# Patient Record
Sex: Female | Born: 1975
Health system: Southern US, Community
[De-identification: ages and names within clinical notes are randomized; demographics above are authoritative.]

---

## 2013-11-25 ENCOUNTER — Encounter (HOSPITAL_BASED_OUTPATIENT_CLINIC_OR_DEPARTMENT_OTHER): Payer: Self-pay | Admitting: Emergency Medicine

## 2013-11-25 ENCOUNTER — Emergency Department (HOSPITAL_BASED_OUTPATIENT_CLINIC_OR_DEPARTMENT_OTHER)
Admission: EM | Admit: 2013-11-25 | Discharge: 2013-11-25 | Disposition: A | Discharge status: Home / Self Care | Payer: BC Managed Care – PPO | Attending: Emergency Medicine | Admitting: Emergency Medicine

## 2013-11-25 ENCOUNTER — Emergency Department (HOSPITAL_BASED_OUTPATIENT_CLINIC_OR_DEPARTMENT_OTHER): Payer: BC Managed Care – PPO

## 2013-11-25 DIAGNOSIS — M79602 Pain in left arm: Secondary | ICD-10-CM

## 2013-11-25 DIAGNOSIS — Y9301 Activity, walking, marching and hiking: Secondary | ICD-10-CM | POA: Diagnosis not present

## 2013-11-25 DIAGNOSIS — Y9289 Other specified places as the place of occurrence of the external cause: Secondary | ICD-10-CM | POA: Diagnosis not present

## 2013-11-25 DIAGNOSIS — M542 Cervicalgia: Secondary | ICD-10-CM | POA: Diagnosis not present

## 2013-11-25 DIAGNOSIS — W57XXXA Bitten or stung by nonvenomous insect and other nonvenomous arthropods, initial encounter: Secondary | ICD-10-CM

## 2013-11-25 DIAGNOSIS — S30861A Insect bite (nonvenomous) of abdominal wall, initial encounter: Secondary | ICD-10-CM | POA: Insufficient documentation

## 2013-11-25 DIAGNOSIS — R202 Paresthesia of skin: Secondary | ICD-10-CM

## 2013-11-25 LAB — CBC WITH DIFFERENTIAL/PLATELET
Basophils Absolute: 0 10*3/uL (ref 0.0–0.1)
Basophils Relative: 1 % (ref 0–1)
EOS PCT: 2 % (ref 0–5)
Eosinophils Absolute: 0.1 10*3/uL (ref 0.0–0.7)
HCT: 37.7 % (ref 36.0–46.0)
HEMOGLOBIN: 12.8 g/dL (ref 12.0–15.0)
LYMPHS ABS: 2.7 10*3/uL (ref 0.7–4.0)
LYMPHS PCT: 43 % (ref 12–46)
MCH: 30.3 pg (ref 26.0–34.0)
MCHC: 34 g/dL (ref 30.0–36.0)
MCV: 89.3 fL (ref 78.0–100.0)
Monocytes Absolute: 0.7 10*3/uL (ref 0.1–1.0)
Monocytes Relative: 11 % (ref 3–12)
NEUTROS PCT: 43 % (ref 43–77)
Neutro Abs: 2.6 10*3/uL (ref 1.7–7.7)
PLATELETS: 187 10*3/uL (ref 150–400)
RBC: 4.22 MIL/uL (ref 3.87–5.11)
RDW: 12.7 % (ref 11.5–15.5)
WBC: 6.1 10*3/uL (ref 4.0–10.5)

## 2013-11-25 LAB — BASIC METABOLIC PANEL
Anion gap: 13 (ref 5–15)
BUN: 9 mg/dL (ref 6–23)
CO2: 23 meq/L (ref 19–32)
Calcium: 9.3 mg/dL (ref 8.4–10.5)
Chloride: 105 mEq/L (ref 96–112)
Creatinine, Ser: 0.7 mg/dL (ref 0.50–1.10)
GFR calc Af Amer: >90 mL/min (ref >90)
GFR calc non Af Amer: >90 mL/min (ref >90)
GLUCOSE: 100 mg/dL — AB (ref 70–99)
POTASSIUM: 3.8 meq/L (ref 3.7–5.3)
Sodium: 141 mEq/L (ref 137–147)

## 2013-11-25 LAB — TROPONIN I: Troponin I: <0.3 ng/mL (ref <0.30)

## 2013-11-25 NOTE — ED Provider Notes (Signed)
CSN: [id number]     Arrival date & time 11/25/13  1913 History  This chart was scribed for Purvis Sheffield, MD by Elon Spanner, ED Scribe. This patient was seen in room MHFT1/MHFT1 and the patient's care was started at 7:41PM.   Chief Complaint  Patient presents with  . Insect Bite   The history is provided by the patient. No language interpreter was used.    HPI Comments: April Powell is a 38 y.o. female with no pertinent medical history who presents to the Emergency Department complaining of an insect bite on her left upper abdomen 1 week ago with associated arm and neck pain.  She reports she was walking when she felt a stinging sensation but denies seeing the insect.  She reports a white bump formed initially.  4 days ago, she reports the arm pain began and changed from intermittent to constant coupled with the onset of her neck pain yesterday.  She describes the left-sided neck and arm pain as throbbing and burning, as well as numb/tingling sensation in her left arm.   Patient denies family history of MI.  Patient denies smoking.  Patient denies known insect allergies.  Patient denies previous allergic reactions.      History reviewed. No pertinent past medical history. History reviewed. No pertinent past surgical history. History reviewed. No pertinent family history. History  Substance Use Topics  . Smoking status: Never Smoker   . Smokeless tobacco: Not on file  . Alcohol Use: No   OB History   Grav Para Term Preterm Abortions TAB SAB Ect Mult Living                 Review of Systems  Constitutional: Negative for appetite change and fatigue.  HENT: Negative for congestion, ear discharge and sinus pressure.   Eyes: Negative for discharge.  Respiratory: Negative for cough.   Cardiovascular: Negative for chest pain.  Gastrointestinal: Negative for abdominal pain and diarrhea.  Genitourinary: Negative for frequency and hematuria.  Musculoskeletal: Positive for myalgias.  Negative for back pain.  Skin: Positive for wound. Negative for rash.  Neurological: Positive for numbness. Negative for seizures and headaches.  Psychiatric/Behavioral: Negative for hallucinations.      Allergies  Review of patient's allergies indicates no known allergies.  Home Medications   Prior to Admission medications   Not on File   BP 111/78  Pulse 80  Temp(Src) 97.6 F (36.4 C) (Oral)  Resp 18  Ht 5\' 6"  (1.676 m)  Wt 130 lb (58.968 kg)  BMI 20.99 kg/m2  SpO2 100%  LMP 11/01/2013 Physical Exam  Nursing note and vitals reviewed. Constitutional: She is oriented to person, place, and time. She appears well-developed and well-nourished.  Non-toxic appearance. She does not appear ill. No distress.  HENT:  Head: Normocephalic and atraumatic.  Right Ear: External ear normal.  Left Ear: External ear normal.  Nose: Nose normal. No mucosal edema or rhinorrhea.  Mouth/Throat: Oropharynx is clear and moist and mucous membranes are normal. No dental abscesses or uvula swelling.  Eyes: Conjunctivae and EOM are normal. Pupils are equal, round, and reactive to light.  Neck: Normal range of motion and full passive range of motion without pain. Neck supple.  Cardiovascular: Normal rate, regular rhythm and normal heart sounds.  Exam reveals no gallop and no friction rub.   No murmur heard. Pulmonary/Chest: Effort normal and breath sounds normal. No respiratory distress. She has no wheezes. She has no rhonchi. She has no rales. She exhibits  no tenderness and no crepitus.  Abdominal: Soft. Normal appearance and bowel sounds are normal. She exhibits no distension. There is no tenderness. There is no rebound and no guarding.  Small circular erythematous area with central excoriation.   Musculoskeletal: Normal range of motion. She exhibits tenderness. She exhibits no edema.  Moves all extremities well.   Mild TTP of the left trapezius and left arm diffusely.   Normal symmettric  appearance of the UE's    Neurological: She is alert and oriented to person, place, and time. She has normal strength. No cranial nerve deficit.  alert, oriented x3 speech: normal in context and clarity memory: intact grossly cranial nerves II-XII: intact motor strength: full proximally and distally no involuntary movements or tremors sensation: intact to light touch diffusely  cerebellar: finger-to-nose and heel-to-shin intact gait: normal forwards and backwards  Skin: Skin is warm, dry and intact. No rash noted. No erythema. No pallor.  Psychiatric: She has a normal mood and affect. Her speech is normal and behavior is normal. Her mood appears not anxious.    ED Course  Procedures (including critical care time)  DIAGNOSTIC STUDIES: Oxygen Saturation is 100% on RA, normal by my interpretation.    COORDINATION OF CARE:  7:53 PM Will order labs and imaging.  Patient acknowledges and agrees with plan.    Labs Review Labs Reviewed  BASIC METABOLIC PANEL - Abnormal; Notable for the following:    Glucose, Bld 100 (*)    All other components within normal limits  CBC WITH DIFFERENTIAL  TROPONIN I    Imaging Review Dg Chest 2 View  11/25/2013   CLINICAL DATA:  Pain beneath the left breast. Status post insect bite on the upper abdomen 1 week ago.  EXAM: CHEST  2 VIEW  COMPARISON:  None.  FINDINGS: Heart size and mediastinal contours are within normal limits. Both lungs are clear. Visualized skeletal structures are unremarkable.  IMPRESSION: Negative exam.   Electronically Signed   By: Drusilla Kannerhomas  Dalessio M.D.   On: 11/25/2013 20:34     EKG Interpretation   Date/Time:  Friday November 25 2013 19:32:45 EDT Ventricular Rate:  68 PR Interval:  142 QRS Duration: 108 QT Interval:  392 QTC Calculation: 416 R Axis:   93 Text Interpretation:  Normal sinus rhythm Rightward axis Incomplete right  bundle branch block Nonspecific ST abnormality no previous for comparison  Confirmed by  Kamy Poinsett  MD, Morissa Obeirne ([id number]) on 11/25/2013 7:35:02 PM      MDM   Final diagnoses:  Insect bite  Arm paresthesia, left  Left arm pain    9:15 PM 38 y.o. female here with an insect stain which occurred approximately one week ago and also some left arm and left lateral neck paresthesias and pain. [facility]. She has a normal neurologic exam. Her symptoms are atypical for a cardiac cause. She has no risk factors for heart disease. Her vital signs are unremarkable. Will get screening labs and imaging.  9:15 PM: I interpreted/reviewed the labs and/or imaging which were non-contributory.  Unsure of what is causing pt's sx, unlikely that her sx are related to an insect sting 1 week ago. She is low risk for MACE per HEART score. Do not think this is a neurological problem such as CVA.  I have discussed the diagnosis/risks/treatment options with the patient and believe the pt to be eligible for discharge home to follow-up with her pcp next week. We  also discussed returning to the ED immediately if new or worsening sx occur. We discussed the sx which are most concerning (e.g., weakness, numbness, worsening pain) that necessitate immediate return. Medications administered to the patient during their visit and any new prescriptions provided to the patient are listed below.  Medications given during this visit Medications - No data to display  New Prescriptions   No medications on file     I personally performed the services described in this documentation, which was scribed in my presence. The recorded information has been reviewed and is accurate.    Purvis SheffieldForrest Master Touchet, MD 11/26/13 0010

## 2013-11-25 NOTE — ED Notes (Signed)
MD at bedside. 

## 2013-11-25 NOTE — ED Notes (Signed)
Pt c/o left  lower abd insect bite x 1 week ago

## 2014-01-24 ENCOUNTER — Encounter: Payer: Self-pay | Admitting: *Deleted

## 2014-01-24 ENCOUNTER — Emergency Department
Admission: EM | Admit: 2014-01-24 | Discharge: 2014-01-24 | Disposition: A | Discharge status: Home / Self Care | Payer: BC Managed Care – PPO | Source: Home / Self Care | Attending: Emergency Medicine | Admitting: Emergency Medicine

## 2014-01-24 DIAGNOSIS — N898 Other specified noninflammatory disorders of vagina: Secondary | ICD-10-CM

## 2014-01-24 LAB — POCT URINE PREGNANCY: Preg Test, Ur: NEGATIVE

## 2014-01-24 MED ORDER — FLUCONAZOLE 200 MG PO TABS: 200.0000 mg | ORAL_TABLET | Freq: Every day | ORAL | Status: DC

## 2014-01-24 NOTE — ED Notes (Signed)
Pt c/o yellow vaginal d/c with itching x 4-5 days. Denies fever, pain or odor. She reports a normal Pap smear approximately 1 year ago.

## 2014-01-24 NOTE — ED Provider Notes (Signed)
CSN: [id number]     Arrival date & time 01/24/14  1500 History   First MD Initiated Contact with Patient 01/24/14 1508     Chief Complaint  Patient presents with  . Vaginal Discharge    HPI Pt c/o light yellow/whitish vaginal d/c with itching x 4-5 days. Denies fever, pain or odor.  History of completing antibiotic (for sinus infection) 7 days ago.--Has had yeast infections in the past after completing antibiotics. She reports a normal [facility] 1 year ago. Denies abdominal or pelvic pain. No fever or chills or nausea or vomiting. Denies history of STD Last menstrual period about 25 days ago. Denies ENT symptoms or chest pain or shortness of breath or GI symptoms. Denies urinary symptoms. Denies dysuria, hematuria. Denies back pain History reviewed. No pertinent past medical history. History reviewed. No pertinent past surgical history. Family History  Problem Relation Age of Onset  . Cancer Mother     Cervical  . Diabetes Father   . Hypertension Father    History  Substance Use Topics  . Smoking status: Never Smoker   . Smokeless tobacco: Not on file  . Alcohol Use: No   OB History    No data available     Review of Systems  All other systems reviewed and are negative.   Allergies  Review of patient's allergies indicates no known allergies.  Home Medications   Prior to Admission medications   Medication Sig Start Date End Date Taking? Authorizing Provider  fluconazole (DIFLUCAN) 200 MG tablet Take 1 tablet (200 mg total) by mouth daily. For 3 days 01/24/14   Lajean Manesavid Massey, MD   BP 99/63 mmHg  Pulse 71  Temp(Src) 98.2 F (36.8 C) (Oral)  Resp 16  Ht 5\' 8"  (1.727 m)  Wt 129 lb (58.514 kg)  BMI 19.62 kg/m2  SpO2 100%  LMP 01/01/2014 Physical Exam  Constitutional: She is oriented to person, place, and time. She appears well-developed and well-nourished. No distress.  HENT:  Head: Normocephalic and  atraumatic.  Eyes: Conjunctivae and EOM are normal. Pupils are equal, round, and reactive to light. No scleral icterus.  Neck: Normal range of motion.  Cardiovascular: Normal rate.   Pulmonary/Chest: Effort normal.  Abdominal: She exhibits no distension.  Musculoskeletal: Normal range of motion.  Neurological: She is alert and oriented to person, place, and time.  Skin: Skin is warm. No rash noted.  Psychiatric: She has a normal mood and affect.  Nursing note and vitals reviewed.  She declined GYN exam today ED Course  Procedures (including critical care time) Labs Review Labs Reviewed  WET PREP, GENITAL  GC/CHLAMYDIA PROBE AMP, URINE  POCT URINE PREGNANCY    MDM   1. Vaginal discharge    urine pregnancy test negative. Most likely, has vaginal yeast infection based on symptoms and history of completing antibiotic (for sinus infection) 7 days ago. She declined GYN exam today. Treatment options discussed, as well as risks, benefits, alternatives. Patient voiced understanding and agreement with the following plans: Begin empiric treatment for vaginal yeast infection. New Prescriptions   FLUCONAZOLE (DIFLUCAN) 200 MG TABLET    Take 1 tablet (200 mg total) by mouth daily. For 3 days   she states she is monogamous with husband, and she requests ("to be on the safe side'), to do various STD testing. From urine and self swab, tests sent for wet prep, GC, chlamydia. We can add or change treatment based on  these tests sent to reference lab. --(She declined any blood work-STD testing.) Over 20 minutes spent, greater than 50% of the time spent for counseling and coordination of care. Follow-up with your primary care doctor in 5-7 days if not improving, or sooner if symptoms become worse. Precautions discussed. Red flags discussed. Questions invited and answered. Patient voiced understanding and agreement.     Lajean Manesavid Massey, MD 01/24/14 [phone]

## 2014-01-25 LAB — GC/CHLAMYDIA PROBE AMP, URINE
Chlamydia, Swab/Urine, PCR: NEGATIVE
GC Probe Amp, Urine: NEGATIVE

## 2014-01-25 LAB — WET PREP, GENITAL
Clue Cells Wet Prep HPF POC: NONE SEEN
Trich, Wet Prep: NONE SEEN
WBC, Wet Prep HPF POC: NONE SEEN
Yeast Wet Prep HPF POC: NONE SEEN

## 2014-02-22 ENCOUNTER — Emergency Department
Admission: EM | Admit: 2014-02-22 | Discharge: 2014-02-22 | Disposition: A | Discharge status: Home / Self Care | Payer: BLUE CROSS/BLUE SHIELD | Source: Home / Self Care | Attending: Family Medicine | Admitting: Family Medicine

## 2014-02-22 ENCOUNTER — Encounter: Payer: Self-pay | Admitting: *Deleted

## 2014-02-22 DIAGNOSIS — L309 Dermatitis, unspecified: Secondary | ICD-10-CM

## 2014-02-22 LAB — POCT CBC W AUTO DIFF (K'VILLE URGENT CARE)

## 2014-02-22 MED ORDER — TERBINAFINE 1 % EX GEL: CUTANEOUS | Status: DC

## 2014-02-22 MED ORDER — HYDROCORTISONE 2.5 % EX CREA: TOPICAL_CREAM | Freq: Two times a day (BID) | CUTANEOUS | Status: DC

## 2014-02-22 NOTE — Discharge Instructions (Signed)
Eczema Eczema, also called atopic dermatitis, is a skin disorder that causes inflammation of the skin. It causes a red rash and dry, scaly skin. The skin becomes very itchy. Eczema is generally worse during the cooler winter months and often improves with the warmth of summer. Eczema usually starts showing signs in infancy. Some children outgrow eczema, but it may last through adulthood.  CAUSES  The exact cause of eczema is not known, but it appears to run in families. People with eczema often have a family history of eczema, allergies, asthma, or hay fever. Eczema is not contagious. Flare-ups of the condition may be caused by:   Contact with something you are sensitive or allergic to.   Stress. SIGNS AND SYMPTOMS  Dry, scaly skin.   Red, itchy rash.   Itchiness. This may occur before the skin rash and may be very intense.  DIAGNOSIS  The diagnosis of eczema is usually made based on symptoms and medical history. TREATMENT  Eczema cannot be cured, but symptoms usually can be controlled with treatment and other strategies. A treatment plan might include:  Controlling the itching and scratching.   Use over-the-counter antihistamines as directed for itching. This is especially useful at night when the itching tends to be worse.   Use over-the-counter steroid creams as directed for itching.   Avoid scratching. Scratching makes the rash and itching worse. It may also result in a skin infection (impetigo) due to a break in the skin caused by scratching.   Keeping the skin well moisturized with creams every day. This will seal in moisture and help prevent dryness. Lotions that contain alcohol and water should be avoided because they can dry the skin.   Limiting exposure to things that you are sensitive or allergic to (allergens).   Recognizing situations that cause stress.   Developing a plan to manage stress.  HOME CARE INSTRUCTIONS   Only take over-the-counter or  prescription medicines as directed by your health care provider.   Do not use anything on the skin without checking with your health care provider.   Keep baths or showers short (5 minutes) in warm (not hot) water. Use mild cleansers for bathing. These should be unscented. You may add nonperfumed bath oil to the bath water. It is best to avoid soap and bubble bath.   Immediately after a bath or shower, when the skin is still damp, apply a moisturizing ointment to the entire body. This ointment should be a petroleum ointment. This will seal in moisture and help prevent dryness. The thicker the ointment, the better. These should be unscented.   Keep fingernails cut short. Children with eczema may need to wear soft gloves or mittens at night after applying an ointment.   Dress in clothes made of cotton or cotton blends. Dress lightly, because heat increases itching.   A child with eczema should stay away from anyone with fever blisters or cold sores. The virus that causes fever blisters (herpes simplex) can cause a serious skin infection in children with eczema. SEEK MEDICAL CARE IF:   Your itching interferes with sleep.   Your rash gets worse or is not better within 1 week after starting treatment.   You see pus or soft yellow scabs in the rash area.   You have a fever.   You have a rash flare-up after contact with someone who has fever blisters.  Document Released: 01/25/2000 Document Revised: 11/17/2012 Document Reviewed: 08/30/2012 [employer] Patient Information 2015 [employer]. This information   is not intended to replace advice given to you by your health care provider. Make sure you discuss any questions you have with your health care provider.  

## 2014-02-22 NOTE — ED Notes (Signed)
April Powell c/o rash to bilateral hands x 3 weeks. C/o itching. Tried benadryl and triamcinolone cream without relief.

## 2014-02-22 NOTE — ED Provider Notes (Signed)
CSN: [id number]     Arrival date & time 02/22/14  1449 History   First MD Initiated Contact with Patient 02/22/14 1515     Chief Complaint  Patient presents with  . Rash      HPI Comments: Patient complains of 3 week history of persistent pruritic rash on her left dorsal wrist and right volar wrist. No known contact with allergens.  She feels well otherwise.  Patient is a 39 y.o. female presenting with rash. The history is provided by the patient.  Rash Location: bilateral wrists. Quality: itchiness   Severity:  Mild Onset quality:  Gradual Duration:  3 weeks Timing:  Constant Progression:  Unchanged Chronicity:  New Context: not animal contact, not chemical exposure, not exposure to similar rash, not insect bite/sting, not medications, not new detergent/soap, not plant contact and not sick contacts   Relieved by:  Nothing Worsened by:  Nothing tried Ineffective treatments:  Antibiotic cream and anti-itch cream Associated symptoms: no fatigue, no fever, no joint pain, no myalgias and no sore throat     History reviewed. No pertinent past medical history. History reviewed. No pertinent past surgical history. Family History  Problem Relation Age of Onset  . Cancer Mother     Cervical  . Diabetes Father   . Hypertension Father    History  Substance Use Topics  . Smoking status: Never Smoker   . Smokeless tobacco: Not on file  . Alcohol Use: No   OB History    No data available     Review of Systems  Constitutional: Negative for fever and fatigue.  HENT: Negative for sore throat.   Musculoskeletal: Negative for myalgias and arthralgias.  Skin: Positive for rash.  All other systems reviewed and are negative.   Allergies  Review of patient's allergies indicates no known allergies.  Home Medications   Prior to Admission medications   Medication Sig Start Date End Date Taking? Authorizing Provider  fluconazole (DIFLUCAN) 200 MG tablet Take 1 tablet (200 mg total)  by mouth daily. For 3 days 01/24/14   Lajean Manesavid Massey, MD  hydrocortisone 2.5 % cream Apply topically 2 (two) times daily. 02/22/14   Lattie HawStephen A Judah Chevere, MD  Terbinafine 1 % GEL Apply to affected area twice daily for one week 02/22/14   Lattie HawStephen A Jerimah Witucki, MD   BP 105/71 mmHg  Pulse 71  Temp(Src) 98.6 F (37 C) (Oral)  Resp 14  Wt 131 lb (59.421 kg)  SpO2 100%  LMP 01/31/2014 Physical Exam  Constitutional: She is oriented to person, place, and time. She appears well-developed and well-nourished. No distress.  HENT:  Head: Normocephalic.  Mouth/Throat: Oropharynx is clear and moist.  Eyes: Conjunctivae are normal. Pupils are equal, round, and reactive to light.  Neck: Neck supple.  Cardiovascular: Normal heart sounds.   Pulmonary/Chest: Breath sounds normal.  Lymphadenopathy:    She has no cervical adenopathy.  Neurological: She is alert and oriented to person, place, and time.  Skin: Skin is warm and dry. Rash noted. Rash is macular.     Confluent macular eruption, slightly erythematous, present on left dorsal wrist and right volar wrist as noted on diagram.    Nursing note and vitals reviewed.   ED Course  Procedures  None    Labs Reviewed  POCT CBC W AUTO DIFF ([facility]):  WBC 6.1; LY 44.9; MO 4.9; GR 50.2; Hgb 12.8; Platelets 194          MDM   1. Dermatitis; ?  atypical tinea versicolor, ?eczema    Begin empiric terbinafine gel BID for one week, and hydrocortisone 2.5% cream BID for one week. Followup with dermatologist if not improved in one week    Lattie Haw, MD 02/26/14 2043

## 2015-11-26 IMAGING — CR DG CHEST 2V
2 series · 2 of 2 positions shown · non-contrast
Comparison: None.

CLINICAL DATA: Pain beneath the left breast. Status post insect
bite on the upper abdomen 1 week ago.

EXAM:
CHEST  2 VIEW

[w chest pa]
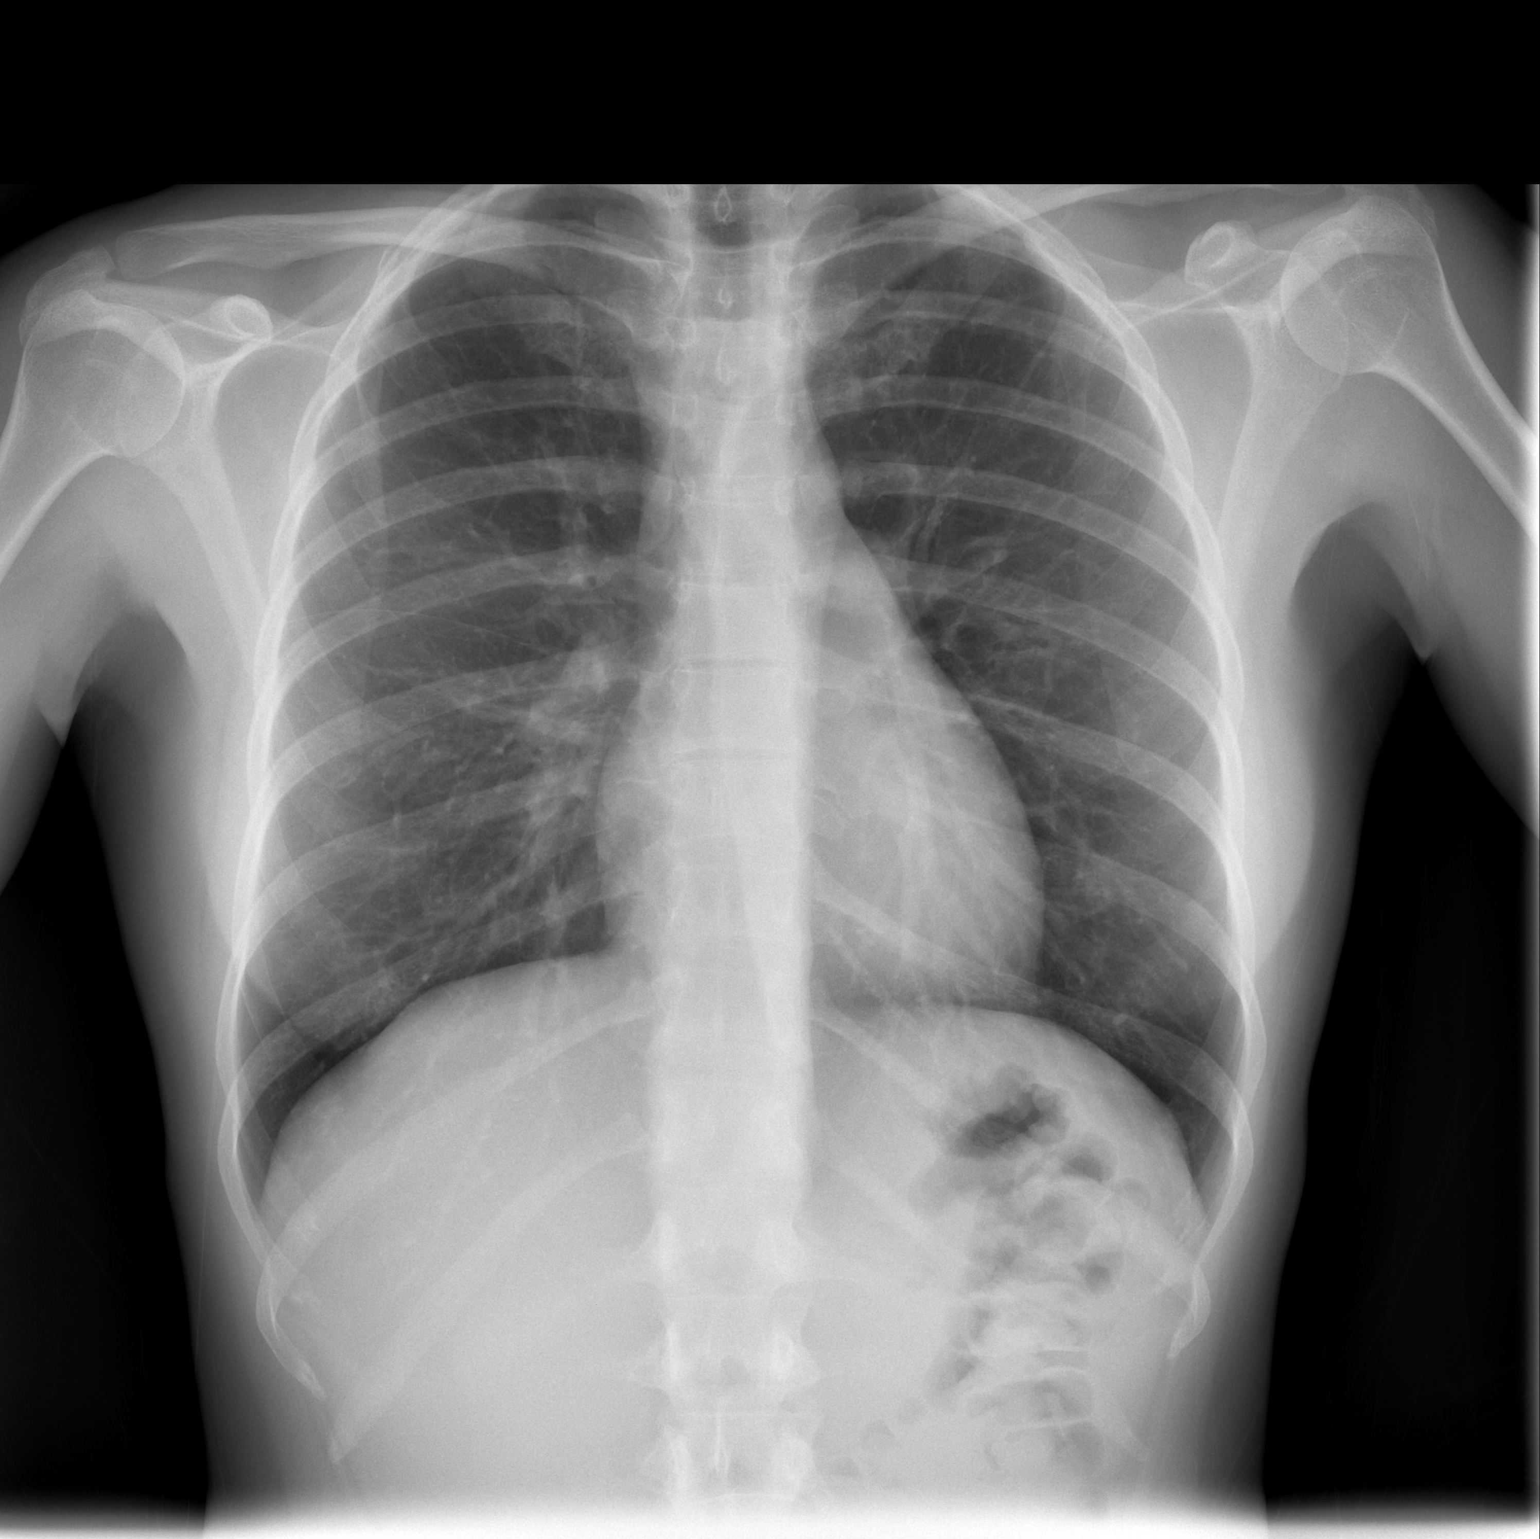

[w chest lat]
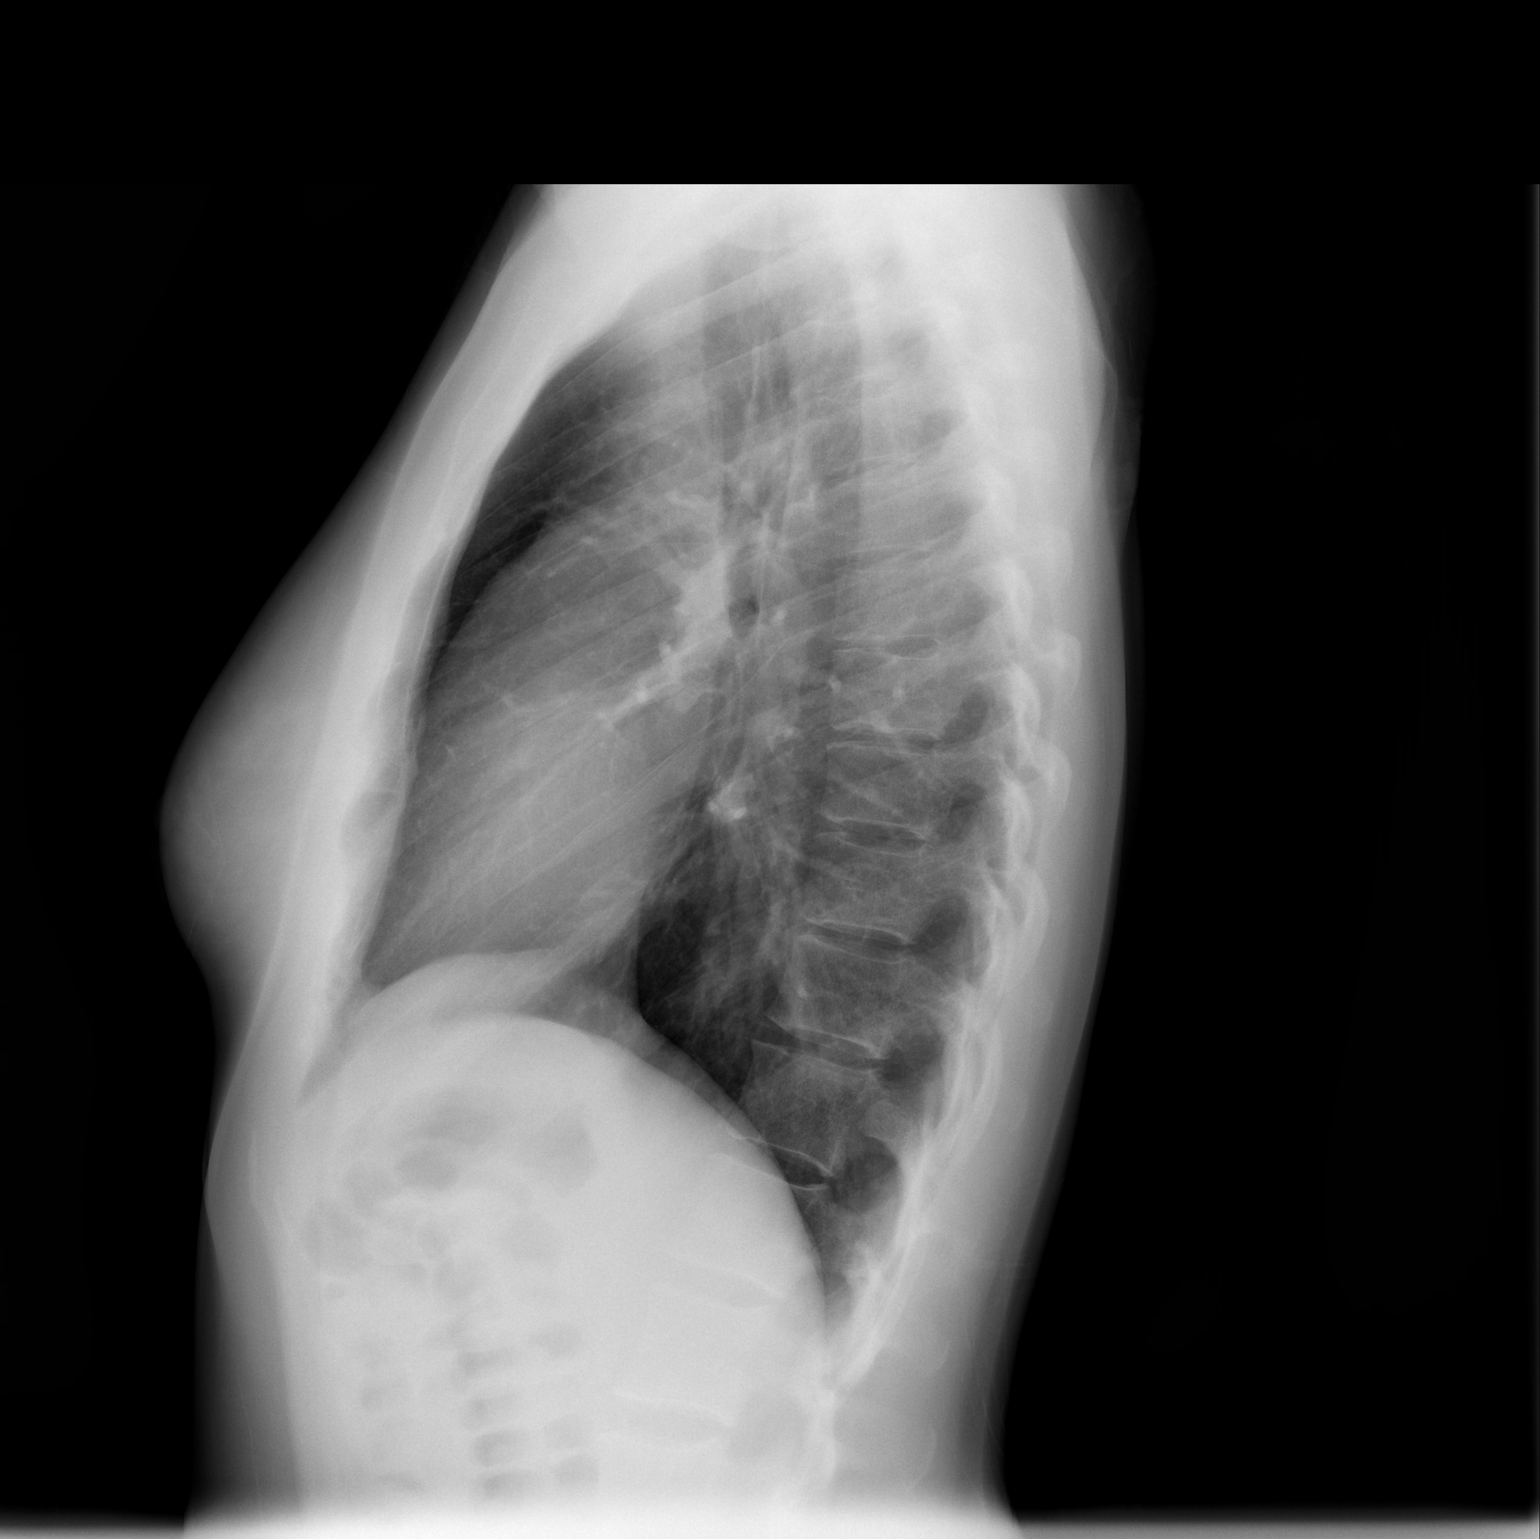

[2 of 2 positions shown; findings below may reference images not displayed]

FINDINGS: Heart size and mediastinal contours are within normal limits. Both
lungs are clear. Visualized skeletal structures are unremarkable.
IMPRESSION: Negative exam.

## 2016-01-16 ENCOUNTER — Encounter: Payer: Self-pay | Admitting: Emergency Medicine

## 2016-01-16 ENCOUNTER — Emergency Department (INDEPENDENT_AMBULATORY_CARE_PROVIDER_SITE_OTHER)
Admission: EM | Admit: 2016-01-16 | Discharge: 2016-01-16 | Disposition: A | Discharge status: Home / Self Care | Payer: BLUE CROSS/BLUE SHIELD | Source: Home / Self Care | Attending: Family Medicine | Admitting: Family Medicine

## 2016-01-16 DIAGNOSIS — G5601 Carpal tunnel syndrome, right upper limb: Secondary | ICD-10-CM

## 2016-01-16 DIAGNOSIS — M25531 Pain in right wrist: Secondary | ICD-10-CM | POA: Diagnosis not present

## 2016-01-16 DIAGNOSIS — M79641 Pain in right hand: Secondary | ICD-10-CM

## 2016-01-16 MED ORDER — MELOXICAM 7.5 MG PO TABS: 7.5000 mg | ORAL_TABLET | Freq: Every day | ORAL | 0 refills | Status: DC

## 2016-01-16 MED ORDER — PREDNISONE 20 MG PO TABS: ORAL_TABLET | ORAL | 0 refills | Status: DC

## 2016-01-16 NOTE — ED Triage Notes (Signed)
Right hand pain radiates up into arm and shoulder, burning sensation, several months.

## 2016-01-16 NOTE — Discharge Instructions (Signed)
°  Meloxicam (Mobic) is an antiinflammatory to help with pain and inflammation.  Do not take ibuprofen, Advil, Aleve, or any other medications that contain NSAIDs while taking meloxicam as this may cause stomach upset or even ulcers if taken in large amounts for an extended period of time.  ° °

## 2016-01-16 NOTE — ED Provider Notes (Signed)
CSN: [id number]     Arrival date & time 01/16/16  [id number] History   First MD Initiated Contact with Patient 01/16/16 0915     Chief Complaint  Patient presents with  . Hand Pain   (Consider location/radiation/quality/duration/timing/severity/associated sxs/prior Treatment) HPI April Powell is a 40 y.o. female presenting to UC with c/o "several months" of Right hand and wrist pain that radiates up her arm. Pain is aching and burning with intermittent numbness.  Pain comes and goes.  She notes she is Right hand dominant. No known injury but notes she does massage her husband's feet and back a lot.  She has not tried any pain medications such as acetaminophen or ibuprofen for pain as she notes "it's not that bad."    History reviewed. No pertinent past medical history. History reviewed. No pertinent surgical history. Family History  Problem Relation Age of Onset  . Cancer Mother     Cervical  . Diabetes Father   . Hypertension Father    Social History  Substance Use Topics  . Smoking status: Never Smoker  . Smokeless tobacco: Never Used  . Alcohol use No   OB History    No data available     Review of Systems  Musculoskeletal: Positive for arthralgias and myalgias. Negative for joint swelling, neck pain and neck stiffness.  Skin: Negative for color change, pallor, rash and wound.  Neurological: Positive for weakness and numbness.    Allergies  Patient has no known allergies.  Home Medications   Prior to Admission medications   Medication Sig Start Date End Date Taking? Authorizing Provider  fluconazole (DIFLUCAN) 200 MG tablet Take 1 tablet (200 mg total) by mouth daily. For 3 days 01/24/14   Lajean Manesavid Massey, MD  hydrocortisone 2.5 % cream Apply topically 2 (two) times daily. 02/22/14   Lattie HawStephen A Beese, MD  meloxicam (MOBIC) 7.5 MG tablet Take 1-2 tablets (7.5-15 mg total) by mouth daily. For 7 days, then daily as needed for pain 01/16/16   Junius FinnerErin O'Malley, PA-C  predniSONE  (DELTASONE) 20 MG tablet 3 tabs po day one, then 2 po daily x 4 days 01/16/16   Junius FinnerErin O'Malley, PA-C  Terbinafine 1 % GEL Apply to affected area twice daily for one week 02/22/14   Lattie HawStephen A Beese, MD   Meds Ordered and Administered this Visit  Medications - No data to display  BP 110/71 (BP Location: Left Arm)   Pulse 67   Temp 97.8 F (36.6 C) (Oral)   Ht 5\' 8"  (1.727 m)   Wt 135 lb (61.2 kg)   LMP 12/30/2015   SpO2 100%   BMI 20.53 kg/m  No data found.   Physical Exam  Constitutional: She is oriented to person, place, and time. She appears well-developed and well-nourished. No distress.  HENT:  Head: Normocephalic and atraumatic.  Eyes: EOM are normal.  Neck: Normal range of motion.  Cardiovascular: Normal rate.   Pulses:      Radial pulses are 2+ on the right side.  Pulmonary/Chest: Effort normal.  Musculoskeletal: Normal range of motion. She exhibits tenderness. She exhibits no edema.  Right hand, wrist and forearm: full ROM, no edema or deformity. Mild tenderness to medial forearm. Positive Tinsel test. 4/5 grip strength compared to Left hand.  Neurological: She is alert and oriented to person, place, and time.  Skin: Skin is warm and dry. Capillary refill takes less than 2 seconds. No rash noted. She is not diaphoretic. No erythema. No pallor.  Psychiatric: She has a normal mood and affect. Her behavior is normal.  Nursing note and vitals reviewed.   Urgent Care Course   Clinical Course     Procedures (including critical care time)  Labs Review Labs Reviewed - No data to display  Imaging Review No results found.    MDM   1. Right wrist pain   2. Hand pain, right   3. Carpal tunnel syndrome on right    Pt c/o intermittent Right hand and arm pain for "several months"  Exam c/w carpal tunnel syndrome.  Wrist splint applied.  Rx: Prednisone and meloxicam  Home care instructions provided. Encouraged to f/u with Dr. Denyse Amassorey, Sports Medicine/Primary Care, in  1-2 weeks if not improving. Patient verbalized understanding and agreement with treatment plan.     Junius Finnerrin O'Malley, PA-C 01/16/16 [phone]

## 2017-02-20 ENCOUNTER — Other Ambulatory Visit: Payer: Self-pay

## 2017-02-20 ENCOUNTER — Emergency Department (INDEPENDENT_AMBULATORY_CARE_PROVIDER_SITE_OTHER)
Admission: EM | Admit: 2017-02-20 | Discharge: 2017-02-20 | Disposition: A | Discharge status: Home / Self Care | Payer: BLUE CROSS/BLUE SHIELD | Source: Home / Self Care | Attending: Family Medicine | Admitting: Family Medicine

## 2017-02-20 ENCOUNTER — Encounter: Payer: Self-pay | Admitting: *Deleted

## 2017-02-20 DIAGNOSIS — J0100 Acute maxillary sinusitis, unspecified: Secondary | ICD-10-CM | POA: Diagnosis not present

## 2017-02-20 MED ORDER — AMOXICILLIN-POT CLAVULANATE 875-125 MG PO TABS: 1.0000 | ORAL_TABLET | Freq: Two times a day (BID) | ORAL | 0 refills | Status: DC

## 2017-02-20 NOTE — ED Provider Notes (Signed)
[facility]    CSN: [id number] Arrival date & time: 02/20/17  1611     History   Chief Complaint Chief Complaint  Patient presents with  . Nasal Congestion    HPI April Powell is a 42 y.o. female.   HPI  April Powell is a 42 y.o. female presenting to UC with c/o nasal congestion for about 1 week, associated sinus pressure and upper tooth pain.  Pt concerned she has a bacterial infection.  She has had sinus infections in the past. Denies fever, chills, n/v/d.     History reviewed. No pertinent past medical history.  There are no active problems to display for this patient.   History reviewed. No pertinent surgical history.  OB History    No data available       Home Medications    Prior to Admission medications   Medication Sig Start Date End Date Taking? Authorizing Provider  amoxicillin-clavulanate (AUGMENTIN) 875-125 MG tablet Take 1 tablet by mouth 2 (two) times daily. One po bid x 7 days 02/20/17   Lurene ShadowPhelps, Patra Gherardi O, PA-C    Family History Family History  Problem Relation Age of Onset  . Cancer Mother        Cervical  . Diabetes Father   . Hypertension Father     Social History Social History   Tobacco Use  . Smoking status: Never Smoker  . Smokeless tobacco: Never Used  Substance Use Topics  . Alcohol use: No  . Drug use: No     Allergies   Patient has no known allergies.   Review of Systems Review of Systems  Constitutional: Negative for chills and fever.  HENT: Positive for congestion, postnasal drip, sinus pressure and sinus pain. Negative for ear pain, sore throat, trouble swallowing and voice change.   Respiratory: Positive for cough. Negative for shortness of breath.   Cardiovascular: Negative for chest pain and palpitations.  Gastrointestinal: Negative for abdominal pain, diarrhea, nausea and vomiting.  Musculoskeletal: Negative for arthralgias, back pain and myalgias.  Skin: Negative for rash.  Neurological: Positive  for headaches. Negative for dizziness and light-headedness.     Physical Exam Triage Vital Signs ED Triage Vitals  Enc Vitals Group     BP 02/20/17 1629 103/66     Pulse Rate 02/20/17 1629 82     Resp 02/20/17 1629 16     Temp 02/20/17 1629 97.8 F (36.6 C)     Temp Source 02/20/17 1629 Oral     SpO2 02/20/17 1629 99 %     Weight 02/20/17 1630 136 lb (61.7 kg)     Height 02/20/17 1630 5\' 8"  (1.727 m)     Head Circumference --      Peak Flow --      Pain Score 02/20/17 1630 0     Pain Loc --      Pain Edu? --      Excl. in GC? --    No data found.  Updated Vital Signs BP 103/66 (BP Location: Right Arm)   Pulse 82   Temp 97.8 F (36.6 C) (Oral)   Resp 16   Ht 5\' 8"  (1.727 m)   Wt 136 lb (61.7 kg)   LMP 02/10/2017   SpO2 99%   BMI 20.68 kg/m   Visual Acuity Right Eye Distance:   Left Eye Distance:   Bilateral Distance:    Right Eye Near:   Left Eye Near:    Bilateral Near:  Physical Exam  Constitutional: She is oriented to person, place, and time. She appears well-developed and well-nourished. No distress.  HENT:  Head: Normocephalic and atraumatic.  Right Ear: Tympanic membrane normal.  Left Ear: Tympanic membrane normal.  Nose: Mucosal edema present. Right sinus exhibits maxillary sinus tenderness. Right sinus exhibits no frontal sinus tenderness. Left sinus exhibits maxillary sinus tenderness. Left sinus exhibits no frontal sinus tenderness.  Mouth/Throat: Uvula is midline, oropharynx is clear and moist and mucous membranes are normal.  Eyes: EOM are normal.  Neck: Normal range of motion. Neck supple.  Cardiovascular: Normal rate and regular rhythm.  Pulmonary/Chest: Effort normal and breath sounds normal. No stridor. No respiratory distress. She has no wheezes. She has no rales.  Musculoskeletal: Normal range of motion.  Lymphadenopathy:    She has no cervical adenopathy.  Neurological: She is alert and oriented to person, place, and time.  Skin:  Skin is warm and dry. She is not diaphoretic.  Psychiatric: She has a normal mood and affect. Her behavior is normal.  Nursing note and vitals reviewed.    UC Treatments / Results  Labs (all labs ordered are listed, but only abnormal results are displayed) Labs Reviewed - No data to display  EKG  EKG Interpretation None       Radiology No results found.  Procedures Procedures (including critical care time)  Medications Ordered in UC Medications - No data to display   Initial Impression / Assessment and Plan / UC Course  I have reviewed the triage vital signs and the nursing notes.  Pertinent labs & imaging results that were available during my care of the patient were reviewed by me and considered in my medical decision making (see chart for details).     Hx and exam c/w sinusitis  Will tx with antibiotics Home care instructions provided F/u with PCP in 1 week if not improving.  Final Clinical Impressions(s) / UC Diagnoses   Final diagnoses:  Acute non-recurrent maxillary sinusitis    ED Discharge Orders        Ordered    amoxicillin-clavulanate (AUGMENTIN) 875-125 MG tablet  2 times daily     02/20/17 1642       Controlled Substance Prescriptions [place] Controlled Substance Registry consulted? Not Applicable   Rolla Plate 02/20/17 1824

## 2017-02-20 NOTE — Discharge Instructions (Signed)
°  You may take 500mg acetaminophen every 4-6 hours or in combination with ibuprofen 400-600mg every 6-8 hours as needed for pain, inflammation, and fever. ° °Be sure to drink at least eight 8oz glasses of water to stay well hydrated and get at least 8 hours of sleep at night, preferably more while sick.  ° °Please take antibiotics as prescribed and be sure to complete entire course even if you start to feel better to ensure infection does not come back. ° °

## 2017-02-20 NOTE — ED Triage Notes (Signed)
Pt c/o nasal congestion x 1 wk; sinus pressure and dental pain x 1 day. She has taken OTC Sudafed and Mucinex DM.

## 2018-01-19 ENCOUNTER — Emergency Department
Admission: EM | Admit: 2018-01-19 | Discharge: 2018-01-19 | Disposition: A | Discharge status: Home / Self Care | Payer: Self-pay | Source: Home / Self Care | Attending: Family Medicine | Admitting: Family Medicine

## 2018-01-19 ENCOUNTER — Other Ambulatory Visit: Payer: Self-pay

## 2018-01-19 DIAGNOSIS — H6692 Otitis media, unspecified, left ear: Secondary | ICD-10-CM

## 2018-01-19 DIAGNOSIS — J069 Acute upper respiratory infection, unspecified: Secondary | ICD-10-CM

## 2018-01-19 MED ORDER — AMOXICILLIN-POT CLAVULANATE 875-125 MG PO TABS: 1.0000 | ORAL_TABLET | Freq: Two times a day (BID) | ORAL | 0 refills | Status: AC

## 2018-01-19 NOTE — Discharge Instructions (Signed)

## 2018-01-19 NOTE — ED Triage Notes (Signed)
Pt stated that sx started Thursday, has a sore throat, pressure in both ears, but worse on the left.  Ears feel full and are popping, and they feel swollen.

## 2018-01-19 NOTE — ED Provider Notes (Signed)
[facility]    CSN: [id number] Arrival date & time: 01/19/18  1547     History   Chief Complaint Chief Complaint  Patient presents with  . Cough  . Sore Throat  . Otalgia    HPI April Powell is a 42 y.o. female.   HPI  April Powell is a 42 y.o. female presenting to UC with c/o 6 days cough congestion mild sore throat and bilateral ear pressure, worse in Left ear.  She has taken OTC cough/cold medications with mild relief. 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 Pt denies fever, chills, n/v/d.   History reviewed. No pertinent past medical history.  There are no active problems to display for this patient.   History reviewed. No pertinent surgical history.  OB History   None      Home Medications    Prior to Admission medications   Medication Sig Start Date End Date Taking? Authorizing Provider  amoxicillin-clavulanate (AUGMENTIN) 875-125 MG tablet Take 1 tablet by mouth 2 (two) times daily for 10 days. 01/19/18 01/29/18  Lurene ShadowPhelps, Tamsen Reist O, PA-C    Family History Family History  Problem Relation Age of Onset  . Cancer Mother        Cervical  . Diabetes Father   . Hypertension Father     Social History Social History   Tobacco Use  . Smoking status: Never Smoker  . Smokeless tobacco: Never Used  Substance Use Topics  . Alcohol use: No  . Drug use: No     Allergies   Patient has no known allergies.   Review of Systems Review of Systems  Constitutional: Negative for chills and fever.  HENT: Positive for congestion, ear pain (Left), rhinorrhea and sore throat.   Respiratory: Positive for cough. Negative for shortness of breath.   Gastrointestinal: Negative for diarrhea, nausea and vomiting.  Neurological: Negative for dizziness, light-headedness and headaches.     Physical Exam Triage Vital Signs ED Triage Vitals  Enc Vitals Group     BP 01/19/18 1605 108/73     Pulse Rate  01/19/18 1605 79     Resp 01/19/18 1605 18     Temp 01/19/18 1605 98.3 F (36.8 C)     Temp Source 01/19/18 1605 Oral     SpO2 01/19/18 1605 97 %     Weight 01/19/18 1606 141 lb (64 kg)     Height 01/19/18 1606 5\' 8"  (1.727 m)     Head Circumference --      Peak Flow --      Pain Score 01/19/18 1605 5     Pain Loc --      Pain Edu? --      Excl. in GC? --    No data found.  Updated Vital Signs BP 108/73 (BP Location: Right Arm)   Pulse 79   Temp 98.3 F (36.8 C) (Oral)   Resp 18   Ht 5\' 8"  (1.727 m)   Wt 141 lb (64 kg)   LMP 01/07/2018   SpO2 97%   BMI 21.44 kg/m   Visual Acuity Right Eye Distance:   Left Eye Distance:   Bilateral Distance:    Right Eye Near:   Left Eye Near:    Bilateral Near:     Physical Exam  Constitutional: She is oriented to person, place, and time. She appears well-developed and well-nourished.  HENT:  Head: Normocephalic and atraumatic.  Right  Ear: Tympanic membrane normal.  Left Ear: Tympanic membrane is erythematous and bulging.  Nose: Rhinorrhea present. Right sinus exhibits no maxillary sinus tenderness and no frontal sinus tenderness. Left sinus exhibits no maxillary sinus tenderness and no frontal sinus tenderness.  Mouth/Throat: Uvula is midline, oropharynx is clear and moist and mucous membranes are normal.  Eyes: EOM are normal.  Neck: Normal range of motion. Neck supple.  Cardiovascular: Normal rate and regular rhythm.  Pulmonary/Chest: Effort normal and breath sounds normal. No stridor. No respiratory distress. She has no wheezes. She has no rhonchi.  Musculoskeletal: Normal range of motion.  Neurological: She is alert and oriented to person, place, and time.  Skin: Skin is warm and dry.  Psychiatric: She has a normal mood and affect. Her behavior is normal.  Nursing note and vitals reviewed.    UC Treatments / Results  Labs (all labs ordered are listed, but only abnormal results are displayed) Labs Reviewed - No data  to display  EKG None  Radiology No results found.  Procedures Procedures (including critical care time)  Medications Ordered in UC Medications - No data to display  Initial Impression / Assessment and Plan / UC Course  I have reviewed the triage vital signs and the nursing notes.  Pertinent labs & imaging results that were available during my care of the patient were reviewed by me and considered in my medical decision making (see chart for details).     Hx and exam c/w Left AOM secondary to URI  Final Clinical Impressions(s) / UC Diagnoses   Final diagnoses:  Upper respiratory tract infection, unspecified type  Left acute otitis media     Discharge Instructions      Please take antibiotics as prescribed and be sure to complete entire course even if you start to feel better to ensure infection does not come back.  You may take 500mg  acetaminophen every 4-6 hours or in combination with ibuprofen 400-600mg  every 6-8 hours as needed for pain, inflammation, and fever.  Be sure to well hydrated with clear liquids and get at least 8 hours of sleep at night, preferably more while sick.   Please follow up with family medicine in 1 week if needed.     ED Prescriptions    Medication Sig Dispense Auth. Provider   amoxicillin-clavulanate (AUGMENTIN) 875-125 MG tablet Take 1 tablet by mouth 2 (two) times daily for 10 days. 20 tablet Lurene Shadow, PA-C     Controlled Substance Prescriptions [place] Controlled Substance Registry consulted? Not Applicable   Rolla Plate 01/19/18 8295

## 2018-08-12 DIAGNOSIS — J029 Acute pharyngitis, unspecified: Secondary | ICD-10-CM | POA: Diagnosis not present

## 2019-03-10 DIAGNOSIS — M79672 Pain in left foot: Secondary | ICD-10-CM | POA: Diagnosis not present

## 2019-03-10 DIAGNOSIS — B351 Tinea unguium: Secondary | ICD-10-CM | POA: Diagnosis not present

## 2019-03-10 DIAGNOSIS — M6701 Short Achilles tendon (acquired), right ankle: Secondary | ICD-10-CM | POA: Diagnosis not present

## 2019-03-10 DIAGNOSIS — M722 Plantar fascial fibromatosis: Secondary | ICD-10-CM | POA: Diagnosis not present

## 2019-03-10 DIAGNOSIS — M7732 Calcaneal spur, left foot: Secondary | ICD-10-CM | POA: Diagnosis not present

## 2019-08-01 DIAGNOSIS — G5762 Lesion of plantar nerve, left lower limb: Secondary | ICD-10-CM | POA: Diagnosis not present

## 2019-08-01 DIAGNOSIS — M722 Plantar fascial fibromatosis: Secondary | ICD-10-CM | POA: Diagnosis not present

## 2019-08-01 DIAGNOSIS — M79672 Pain in left foot: Secondary | ICD-10-CM | POA: Diagnosis not present

## 2019-08-01 DIAGNOSIS — M7742 Metatarsalgia, left foot: Secondary | ICD-10-CM | POA: Diagnosis not present

## 2019-08-29 ENCOUNTER — Other Ambulatory Visit: Payer: Self-pay

## 2019-08-29 ENCOUNTER — Emergency Department (INDEPENDENT_AMBULATORY_CARE_PROVIDER_SITE_OTHER)
Admission: EM | Admit: 2019-08-29 | Discharge: 2019-08-29 | Disposition: A | Discharge status: Home / Self Care | Payer: BC Managed Care – PPO | Source: Home / Self Care

## 2019-08-29 DIAGNOSIS — Z111 Encounter for screening for respiratory tuberculosis: Secondary | ICD-10-CM | POA: Diagnosis not present

## 2019-08-29 MED ORDER — TUBERCULIN PPD 5 UNIT/0.1ML ID SOLN
5.0000 [IU] | Freq: Once | INTRADERMAL | Status: DC
  Administered 2019-08-29: 5 [IU] via INTRADERMAL

## 2019-08-29 NOTE — ED Triage Notes (Signed)
Patient presents to Urgent Care with complaints of needing a PPD placement since she needs it for work. 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

## 2019-09-01 ENCOUNTER — Encounter: Payer: Self-pay | Admitting: Medical-Surgical

## 2019-09-01 ENCOUNTER — Emergency Department (INDEPENDENT_AMBULATORY_CARE_PROVIDER_SITE_OTHER)
Admission: EM | Admit: 2019-09-01 | Discharge: 2019-09-01 | Disposition: A | Discharge status: Home / Self Care | Payer: BC Managed Care – PPO | Source: Home / Self Care

## 2019-09-01 ENCOUNTER — Ambulatory Visit (INDEPENDENT_AMBULATORY_CARE_PROVIDER_SITE_OTHER): Payer: BC Managed Care – PPO | Admitting: Medical-Surgical

## 2019-09-01 ENCOUNTER — Other Ambulatory Visit: Payer: Self-pay

## 2019-09-01 VITALS — BP 112/75 | HR 79 | Temp 98.1°F | Ht 67.0 in | Wt 138.6 lb

## 2019-09-01 DIAGNOSIS — Z0000 Encounter for general adult medical examination without abnormal findings: Secondary | ICD-10-CM | POA: Diagnosis not present

## 2019-09-01 DIAGNOSIS — Z1159 Encounter for screening for other viral diseases: Secondary | ICD-10-CM

## 2019-09-01 DIAGNOSIS — Z114 Encounter for screening for human immunodeficiency virus [HIV]: Secondary | ICD-10-CM | POA: Diagnosis not present

## 2019-09-01 DIAGNOSIS — Z111 Encounter for screening for respiratory tuberculosis: Secondary | ICD-10-CM

## 2019-09-01 DIAGNOSIS — Z7689 Persons encountering health services in other specified circumstances: Secondary | ICD-10-CM

## 2019-09-01 LAB — READ PPD: TB Skin Test: NEGATIVE

## 2019-09-01 NOTE — ED Triage Notes (Signed)
Pt here today for PPD reading from 08/29/19

## 2019-09-01 NOTE — Progress Notes (Signed)
New Patient Office Visit  Subjective:  Patient ID: April Powell, female    DOB: 1975-11-24  Age: 44 y.o. MRN: [id number]  CC:  Chief Complaint  Patient presents with  . Establish Care    HPI April Powell presents to establish care.  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 Has a physical form to fill out.  Dentist- every 6 months, something is going on and she doesn't remember what it is.  Eye exam- never had one in her adult life, some blurry vision in the mornings Exercise- daily elliptical and weights, walks 5 miles daily at work Diet- well-balanced, no special diets  History reviewed. No pertinent past medical history.  History reviewed. No pertinent surgical history.  Family History  Problem Relation Age of Onset  . Cervical cancer Mother   . Diabetes Father   . Hypertension Father   . Diabetes Brother   . Hypertension Brother   . Diabetes Maternal Aunt     Social History   Socioeconomic History  . Marital status: Married    Spouse name: Not on file  . Number of children: Not on file  . Years of education: Not on file  . Highest education level: Not on file  Occupational History  . Not on file  Tobacco Use  . Smoking status: Never Smoker  . Smokeless tobacco: Never Used  Vaping Use  . Vaping Use: Never used  Substance and Sexual Activity  . Alcohol use: No  . Drug use: No  . Sexual activity: Yes    Partners: Male    Birth control/protection: Surgical  Other Topics Concern  . Not on file  Social History Narrative  . Not on file   Social Determinants of Health   Financial Resource Strain:   . Difficulty of Paying Living Expenses:   Food Insecurity:   . Worried About Programme researcher, broadcasting/film/video in the Last Year:   . Barista in the Last Year:   Transportation Needs:   . Freight forwarder (Medical):   Marland Kitchen Lack of Transportation  (Non-Medical):   Physical Activity:   . Days of Exercise per Week:   . Minutes of Exercise per Session:   Stress:   . Feeling of Stress :   Social Connections:   . Frequency of Communication with Friends and Family:   . Frequency of Social Gatherings with Friends and Family:   . Attends Religious Services:   . Active Member of Clubs or Organizations:   . Attends Banker Meetings:   Marland Kitchen Marital Status:   Intimate Partner Violence:   . Fear of Current or Ex-Partner:   . Emotionally Abused:   Marland Kitchen Physically Abused:   . Sexually Abused:     ROS Review of Systems  Constitutional: Negative for chills, fatigue, fever and unexpected weight change.  HENT: Positive for sneezing. Negative for congestion, ear discharge, ear pain, rhinorrhea, sinus pressure, sinus pain, sore throat and trouble swallowing.   Eyes: Negative for pain and visual disturbance.  Respiratory: Negative for cough, chest tightness, shortness of breath and wheezing.   Cardiovascular: Negative for chest pain, palpitations and leg swelling.  Gastrointestinal: Negative for abdominal pain, blood in stool, constipation, diarrhea, nausea and vomiting.  Genitourinary: Negative for dysuria, frequency, hematuria, urgency, vaginal bleeding and vaginal discharge.  Allergic/Immunologic: Positive for environmental allergies. Negative for  food allergies.  Neurological: Negative for dizziness, seizures, syncope, light-headedness, numbness and headaches.  Hematological: Negative for adenopathy. Does not bruise/bleed easily.  Psychiatric/Behavioral: Negative for dysphoric mood, self-injury, sleep disturbance and suicidal ideas. The patient is not nervous/anxious.     Objective:   Today's Vitals: BP 112/75   Pulse 79   Temp 98.1 F (36.7 C) (Oral)   Ht 5\' 7"  (1.702 m)   Wt 138 lb 9.6 oz (62.9 kg)   LMP 08/13/2019   SpO2 97%   BMI 21.71 kg/m   Physical Exam Constitutional:      General: She is not in acute distress.     Appearance: Normal appearance. She is not ill-appearing.  HENT:     Head: Normocephalic and atraumatic.     Right Ear: Tympanic membrane normal.     Left Ear: Tympanic membrane normal.     Nose: Nose normal.     Mouth/Throat:     Mouth: Mucous membranes are moist.     Pharynx: No oropharyngeal exudate or posterior oropharyngeal erythema.  Eyes:     Extraocular Movements: Extraocular movements intact.     Conjunctiva/sclera: Conjunctivae normal.     Pupils: Pupils are equal, round, and reactive to light.  Neck:     Thyroid: No thyromegaly.     Vascular: No carotid bruit or JVD.     Trachea: Trachea normal.  Cardiovascular:     Rate and Rhythm: Normal rate and regular rhythm.     Pulses: Normal pulses.     Heart sounds: Normal heart sounds. No murmur heard.  No friction rub. No gallop.   Pulmonary:     Effort: Pulmonary effort is normal. No respiratory distress.     Breath sounds: Normal breath sounds. No wheezing.  Abdominal:     General: Bowel sounds are normal. There is no distension.     Palpations: Abdomen is soft.     Tenderness: There is no abdominal tenderness. There is no guarding.  Musculoskeletal:        General: Normal range of motion.     Cervical back: Normal range of motion and neck supple.  Skin:    General: Skin is warm and dry.  Neurological:     Mental Status: She is alert and oriented to person, place, and time.     Cranial Nerves: No cranial nerve deficit.  Psychiatric:        Mood and Affect: Mood normal.        Behavior: Behavior normal.        Thought Content: Thought content normal.        Judgment: Judgment normal.     Assessment & Plan:   1. Annual physical exam Checking CBC, CMP, and lipid panel.  - CBC - COMPLETE METABOLIC PANEL WITH GFR - Lipid panel  2. Encounter to establish care Reviewed available records and discussed health concerns.  3. Encounter for hepatitis C screening test for low risk patient Discussed screening  recommendations. Patient agreeable so adding this to blood work today. - Hepatitis C antibody  4. Encounter for screening for HIV Discussed screening recommendations. Patient agreeable so adding this to blood work today. - HIV Antibody (routine testing w rflx)  No outpatient encounter medications on file as of 09/01/2019.   No facility-administered encounter medications on file as of 09/01/2019.   Follow-up: Return in about 1 year (around 08/31/2020) for annual physical exam or sooner if needed.   09/02/2020, DNP, APRN, FNP-BC [facility]  [place] [facility]

## 2019-09-05 DIAGNOSIS — Z114 Encounter for screening for human immunodeficiency virus [HIV]: Secondary | ICD-10-CM | POA: Diagnosis not present

## 2019-09-05 DIAGNOSIS — Z0000 Encounter for general adult medical examination without abnormal findings: Secondary | ICD-10-CM | POA: Diagnosis not present

## 2019-09-05 DIAGNOSIS — Z1159 Encounter for screening for other viral diseases: Secondary | ICD-10-CM | POA: Diagnosis not present

## 2019-09-06 LAB — COMPLETE METABOLIC PANEL WITH GFR
AG Ratio: 1.9 (calc) (ref 1.0–2.5)
ALT: 10 U/L (ref 6–29)
AST: 17 U/L (ref 10–30)
Albumin: 4.7 g/dL (ref 3.6–5.1)
Alkaline phosphatase (APISO): 47 U/L (ref 31–125)
BUN: 11 mg/dL (ref 7–25)
CO2: 22 mmol/L (ref 20–32)
Calcium: 9.4 mg/dL (ref 8.6–10.2)
Chloride: 107 mmol/L (ref 98–110)
Creat: 0.75 mg/dL (ref 0.50–1.10)
GFR, Est African American: 114 mL/min/{1.73_m2} (ref >=60)
GFR, Est Non African American: 98 mL/min/{1.73_m2} (ref >=60)
Globulin: 2.5 g/dL (calc) (ref 1.9–3.7)
Glucose, Bld: 85 mg/dL (ref 65–99)
Potassium: 4.1 mmol/L (ref 3.5–5.3)
Sodium: 139 mmol/L (ref 135–146)
Total Bilirubin: 0.4 mg/dL (ref 0.2–1.2)
Total Protein: 7.2 g/dL (ref 6.1–8.1)

## 2019-09-06 LAB — CBC
HCT: 40.9 % (ref 35.0–45.0)
Hemoglobin: 13.8 g/dL (ref 11.7–15.5)
MCH: 30.6 pg (ref 27.0–33.0)
MCHC: 33.7 g/dL (ref 32.0–36.0)
MCV: 90.7 fL (ref 80.0–100.0)
MPV: 10.7 fL (ref 7.5–12.5)
Platelets: 227 10*3/uL (ref 140–400)
RBC: 4.51 10*6/uL (ref 3.80–5.10)
RDW: 12.2 % (ref 11.0–15.0)
WBC: 5.8 10*3/uL (ref 3.8–10.8)

## 2019-09-06 LAB — LIPID PANEL
Cholesterol: 149 mg/dL (ref <200)
HDL: 57 mg/dL (ref >=50)
LDL Cholesterol (Calc): 78 mg/dL (calc)
Non-HDL Cholesterol (Calc): 92 mg/dL (calc) (ref <130)
Total CHOL/HDL Ratio: 2.6 (calc) (ref <5.0)
Triglycerides: 67 mg/dL (ref <150)

## 2019-09-06 LAB — HEPATITIS C ANTIBODY
Hepatitis C Ab: NONREACTIVE
SIGNAL TO CUT-OFF: 0.02 (ref <1.00)

## 2019-09-06 LAB — HIV ANTIBODY (ROUTINE TESTING W REFLEX): HIV 1&2 Ab, 4th Generation: NONREACTIVE

## 2020-05-28 ENCOUNTER — Ambulatory Visit: Payer: BC Managed Care – PPO | Admitting: Medical-Surgical

## 2020-05-28 DIAGNOSIS — Z124 Encounter for screening for malignant neoplasm of cervix: Secondary | ICD-10-CM

## 2020-06-14 DIAGNOSIS — Z20822 Contact with and (suspected) exposure to covid-19: Secondary | ICD-10-CM | POA: Diagnosis not present

## 2020-06-14 DIAGNOSIS — J029 Acute pharyngitis, unspecified: Secondary | ICD-10-CM | POA: Diagnosis not present

## 2020-06-14 DIAGNOSIS — J0100 Acute maxillary sinusitis, unspecified: Secondary | ICD-10-CM | POA: Diagnosis not present

## 2022-02-09 ENCOUNTER — Ambulatory Visit
Admission: EM | Admit: 2022-02-09 | Discharge: 2022-02-09 | Disposition: A | Discharge status: Home / Self Care | Payer: BC Managed Care – PPO

## 2022-02-09 ENCOUNTER — Other Ambulatory Visit: Payer: Self-pay

## 2022-02-09 DIAGNOSIS — J329 Chronic sinusitis, unspecified: Secondary | ICD-10-CM

## 2022-02-09 DIAGNOSIS — J40 Bronchitis, not specified as acute or chronic: Secondary | ICD-10-CM

## 2022-02-09 MED ORDER — PREDNISONE 20 MG PO TABS: 40.0000 mg | ORAL_TABLET | Freq: Every day | ORAL | 0 refills | Status: AC

## 2022-02-09 MED ORDER — PROMETHAZINE-DM 6.25-15 MG/5ML PO SYRP: 5.0000 mL | ORAL_SOLUTION | Freq: Two times a day (BID) | ORAL | 0 refills | Status: DC | PRN

## 2022-02-09 MED ORDER — DOXYCYCLINE HYCLATE 100 MG PO CAPS: 100.0000 mg | ORAL_CAPSULE | Freq: Two times a day (BID) | ORAL | 0 refills | Status: DC

## 2022-02-09 MED ORDER — ALBUTEROL SULFATE HFA 108 (90 BASE) MCG/ACT IN AERS: 1.0000 | INHALATION_SPRAY | Freq: Four times a day (QID) | RESPIRATORY_TRACT | 0 refills | Status: DC | PRN

## 2022-02-09 NOTE — Discharge Instructions (Signed)
Start doxycycline 100 mg twice daily for 10 days.  Stay out of the sun while on this medication as it can make you sensitive to the sun.  Take prednisone 40 mg for 4 days.  Do not take NSAIDs with this medication including aspirin, ibuprofen/Advil, naproxen/Aleve.  Use your albuterol inhaler every 4-6 hours as needed.  Take Promethazine DM up to twice a day as needed for cough.  This will make you sleepy so do not drive or drink alcohol with taking it.  You can continue over-the-counter medication including Mucinex, Tylenol, Flonase.  Ensure that you are resting and drinking plenty of fluid.  If your symptoms or not improving within a few days please return for reevaluation.  If anything worsens you develop high fever, worsening cough, shortness of breath, weakness, nausea/vomiting interfere with oral intake you need to be seen immediately.

## 2022-02-09 NOTE — ED Provider Notes (Signed)
[facility]    CSN: [id number] Arrival date & time: 02/09/22  7829      History   Chief Complaint Chief Complaint  Patient presents with   Cough   Nasal Congestion   Sore Throat    HPI April Powell is a 46 y.o. female.   Patient presents today with a 3-week history of URI symptoms that have worsened in the past week prompting evaluation.  Reports fever, congestion, sore throat, body aches, nausea, cough, shortness of breath intermittently.  Denies chest pain, vomiting, weakness.  She did take an at-home COVID test that was negative when symptoms began.  She denies any recent antibiotics or steroids.  She has been using DayQuil and Mucinex DM without improvement of symptoms.  She denies any significant past medical history including allergies, asthma, COPD.  She does not smoke.  She is confident that she is not pregnant.    History reviewed. No pertinent past medical history.  There are no problems to display for this patient.   History reviewed. No pertinent surgical history.  OB History   No obstetric history on file.      Home Medications    Prior to Admission medications   Medication Sig Start Date End Date Taking? Authorizing Provider  albuterol (VENTOLIN HFA) 108 (90 Base) MCG/ACT inhaler Inhale 1-2 puffs into the lungs every 6 (six) hours as needed for wheezing or shortness of breath. 02/09/22  Yes Yatzary Merriweather K, PA-C  doxycycline (VIBRAMYCIN) 100 MG capsule Take 1 capsule (100 mg total) by mouth 2 (two) times daily. 02/09/22  Yes Shawndra Clute K, PA-C  predniSONE (DELTASONE) 20 MG tablet Take 2 tablets (40 mg total) by mouth daily for 4 days. 02/09/22 02/13/22 Yes Bentley Haralson K, PA-C  promethazine-dextromethorphan (PROMETHAZINE-DM) 6.25-15 MG/5ML syrup Take 5 mLs by mouth 2 (two) times daily as needed for cough. 02/09/22  Yes Stachia Slutsky K, PA-C  Pseudoephedrine-APAP-DM (DAYQUIL PO) Take by mouth.   Yes [provider]    Family  History Family History  Problem Relation Age of Onset   Cervical cancer Mother    Diabetes Father    Hypertension Father    Diabetes Brother    Hypertension Brother    Diabetes Maternal Aunt     Social History Social History   Tobacco Use   Smoking status: Never   Smokeless tobacco: Never  Vaping Use   Vaping Use: Never used  Substance Use Topics   Alcohol use: No   Drug use: No     Allergies   Amoxicillin   Review of Systems Review of Systems  Constitutional:  Positive for activity change, appetite change and fatigue. Negative for fever.  HENT:  Positive for congestion, sinus pressure and sore throat. Negative for sneezing.   Respiratory:  Positive for cough, chest tightness and shortness of breath.   Cardiovascular:  Negative for chest pain.  Gastrointestinal:  Positive for nausea. Negative for abdominal pain, diarrhea and vomiting.  Neurological:  Positive for headaches. Negative for dizziness and light-headedness.     Physical Exam Triage Vital Signs ED Triage Vitals  Enc Vitals Group     BP 02/09/22 1014 97/67     Pulse Rate 02/09/22 1014 (!) 103     Resp 02/09/22 1014 20     Temp 02/09/22 1014 98.9 F (37.2 C)     Temp Source 02/09/22 1014 Oral     SpO2 02/09/22 1014 97 %     Weight 02/09/22 1010 129 lb (  58.5 kg)     Height 02/09/22 1010 5\' 8"  (1.727 m)     Head Circumference --      Peak Flow --      Pain Score 02/09/22 1010 7     Pain Loc --      Pain Edu? --      Excl. in GC? --    No data found.  Updated Vital Signs BP 106/73 (BP Location: Right Arm)   Pulse (!) 103   Temp 98.9 F (37.2 C) (Oral)   Resp 20   Ht 5\' 8"  (1.727 m)   Wt 129 lb (58.5 kg)   LMP 02/01/2022 (Approximate)   SpO2 97%   BMI 19.61 kg/m   Visual Acuity Right Eye Distance:   Left Eye Distance:   Bilateral Distance:    Right Eye Near:   Left Eye Near:    Bilateral Near:     Physical Exam Vitals reviewed.  Constitutional:      General: She is awake. She  is not in acute distress.    Appearance: Normal appearance. She is well-developed. She is not ill-appearing.     Comments: Very pleasant female presented age in no acute distress sitting comfortably in exam room  HENT:     Head: Normocephalic and atraumatic.     Right Ear: Tympanic membrane, ear canal and external ear normal. Tympanic membrane is not erythematous or bulging.     Left Ear: Tympanic membrane, ear canal and external ear normal. Tympanic membrane is not erythematous or bulging.     Nose:     Right Sinus: Maxillary sinus tenderness present. No frontal sinus tenderness.     Left Sinus: Maxillary sinus tenderness present. No frontal sinus tenderness.     Mouth/Throat:     Pharynx: Uvula midline. Posterior oropharyngeal erythema present. No oropharyngeal exudate.     Comments: Erythema and drainage in posterior oropharynx Cardiovascular:     Rate and Rhythm: Normal rate and regular rhythm.     Heart sounds: Normal heart sounds, S1 normal and S2 normal. No murmur heard. Pulmonary:     Effort: Pulmonary effort is normal.     Breath sounds: Normal breath sounds. No wheezing, rhonchi or rales.  Psychiatric:        Behavior: Behavior is cooperative.      UC Treatments / Results  Labs (all labs ordered are listed, but only abnormal results are displayed) Labs Reviewed - No data to display  EKG   Radiology No results found.  Procedures Procedures (including critical care time)  Medications Ordered in UC Medications - No data to display  Initial Impression / Assessment and Plan / UC Course  I have reviewed the triage vital signs and the nursing notes.  Pertinent labs & imaging results that were available during my care of the patient were reviewed by me and considered in my medical decision making (see chart for details).     No indication for viral testing as patient has been symptomatic for several weeks and has already had a negative at-home COVID test.  Concern  for secondary bacterial infection given prolonged and worsening symptoms.  Patient was started on doxycycline given history of hives with amoxicillin.  Discussed that this can increase her sensitivity to the sun and she should avoid prolonged sun exposure while on this medication.  She was started on a prednisone burst of 40 mg for 4 days with instruction to avoid NSAIDs with this medication.  Prescription for albuterol  was sent to pharmacy to be used with shortness of breath and coughing fits.  Promethazine DM was provided for cough.  Discussed that this can be sedating and she is not to drive or drink alcohol while taking it.  She can use over-the-counter medication including Mucinex, Flonase, Tylenol.  She is to rest and drink plenty of fluid.  If her symptoms are not improving within 5 days she should return for reevaluation.  If she has any worsening symptoms she is to be seen immediately including chest pain, shortness of breath, fever, nausea/vomiting interfere with oral intake, weakness.  Strict return precautions given.  Work excuse note provided.  Final Clinical Impressions(s) / UC Diagnoses   Final diagnoses:  Sinobronchitis     Discharge Instructions      Start doxycycline 100 mg twice daily for 10 days.  Stay out of the sun while on this medication as it can make you sensitive to the sun.  Take prednisone 40 mg for 4 days.  Do not take NSAIDs with this medication including aspirin, ibuprofen/Advil, naproxen/Aleve.  Use your albuterol inhaler every 4-6 hours as needed.  Take Promethazine DM up to twice a day as needed for cough.  This will make you sleepy so do not drive or drink alcohol with taking it.  You can continue over-the-counter medication including Mucinex, Tylenol, Flonase.  Ensure that you are resting and drinking plenty of fluid.  If your symptoms or not improving within a few days please return for reevaluation.  If anything worsens you develop high fever, worsening cough,  shortness of breath, weakness, nausea/vomiting interfere with oral intake you need to be seen immediately.     ED Prescriptions     Medication Sig Dispense Auth. Provider   doxycycline (VIBRAMYCIN) 100 MG capsule Take 1 capsule (100 mg total) by mouth 2 (two) times daily. 20 capsule Tehran Rabenold K, PA-C   predniSONE (DELTASONE) 20 MG tablet Take 2 tablets (40 mg total) by mouth daily for 4 days. 8 tablet Chick Cousins K, PA-C   albuterol (VENTOLIN HFA) 108 (90 Base) MCG/ACT inhaler Inhale 1-2 puffs into the lungs every 6 (six) hours as needed for wheezing or shortness of breath. 18 g Katee Wentland K, PA-C   promethazine-dextromethorphan (PROMETHAZINE-DM) 6.25-15 MG/5ML syrup Take 5 mLs by mouth 2 (two) times daily as needed for cough. 118 mL Avynn Klassen K, PA-C      PDMP not reviewed this encounter.   Jeani Hawking, PA-C 02/09/22 1031

## 2022-02-09 NOTE — ED Triage Notes (Addendum)
Pt presents to Urgent Care with c/o cough, intermittent fever, nasal congestion, sore throat, body aches, and nausea x 3 weeks. Home COVID test was negative.

## 2022-02-13 ENCOUNTER — Telehealth: Payer: Self-pay

## 2022-02-13 NOTE — Telephone Encounter (Signed)
Patient called clinic regarding 12/31 visit. I reviewed chart and discussed discharge instructions. Discussed prescriptions with patient she reports not taking the prednisone as prescribed. Instructed her to follow discharge instructions and if not improving or worsening to come back in for eval or to go to the ED. Voiced understanding.

## 2023-03-17 ENCOUNTER — Ambulatory Visit
Admission: EM | Admit: 2023-03-17 | Discharge: 2023-03-17 | Disposition: A | Discharge status: Home / Self Care | Payer: Self-pay | Attending: Family Medicine | Admitting: Family Medicine

## 2023-03-17 ENCOUNTER — Other Ambulatory Visit: Payer: Self-pay

## 2023-03-17 DIAGNOSIS — J0100 Acute maxillary sinusitis, unspecified: Secondary | ICD-10-CM

## 2023-03-17 DIAGNOSIS — R0981 Nasal congestion: Secondary | ICD-10-CM

## 2023-03-17 MED ORDER — PREDNISONE 20 MG PO TABS: ORAL_TABLET | ORAL | 0 refills | Status: DC

## 2023-03-17 MED ORDER — DOXYCYCLINE HYCLATE 100 MG PO CAPS: 100.0000 mg | ORAL_CAPSULE | Freq: Two times a day (BID) | ORAL | 0 refills | Status: AC

## 2023-03-17 NOTE — Discharge Instructions (Addendum)
 Advised patient to take medications as directed with food to completion.  Advised patient to take prednisone  with first dose of doxycycline  for the next 5 of 7 days.  Encouraged to increase daily water intake to 64 ounces per day while taking these medications.  Advised if symptoms worsen and/or unresolved please follow-up with your PCP or here for further evaluation.

## 2023-03-17 NOTE — ED Triage Notes (Addendum)
Pt c/o sinus pressure/facial pain x 1 week. Also c/o bilateral ear pain. Denies fever. Mucinex severe cold and flu prn.

## 2023-03-17 NOTE — ED Provider Notes (Signed)
 [name]    CSN: [id number] Arrival date & time: 03/17/23  1110      History   Chief Complaint Chief Complaint  Patient presents with   Facial Pain   Otalgia    Bilateral    HPI April Powell is a 48 y.o. female.   HPI 48 year old female presents with sinus nasal congestion and bilateral ear pain for 1 week.  Patient is concerned this may be sinus infection.  History reviewed. No pertinent past medical history.  There are no active problems to display for this patient.   History reviewed. No pertinent surgical history.  OB History   No obstetric history on file.      Home Medications    Prior to Admission medications   Medication Sig Start Date End Date Taking? Authorizing Provider  doxycycline  (VIBRAMYCIN ) 100 MG capsule Take 1 capsule (100 mg total) by mouth 2 (two) times daily for 7 days. 03/17/23 03/24/23 Yes Teddy Sharper, FNP  predniSONE  (DELTASONE ) 20 MG tablet Take 3 tabs PO daily x 5 days. 03/17/23  Yes Teddy Sharper, FNP  albuterol  (VENTOLIN  HFA) 108 (90 Base) MCG/ACT inhaler Inhale 1-2 puffs into the lungs every 6 (six) hours as needed for wheezing or shortness of breath. 02/09/22   Raspet, Erin K, PA-C  Pseudoephedrine-APAP-DM (DAYQUIL PO) Take by mouth.    [provider]    Family History Family History  Problem Relation Age of Onset   Cervical cancer Mother    Diabetes Father    Hypertension Father    Diabetes Brother    Hypertension Brother    Diabetes Maternal Aunt     Social History Social History   Tobacco Use   Smoking status: Never   Smokeless tobacco: Never  Vaping Use   Vaping status: Never Used  Substance Use Topics   Alcohol use: No   Drug use: No     Allergies   Amoxicillin    Review of Systems Review of Systems  HENT:  Positive for ear pain, sinus pressure and sinus pain.      Physical Exam Triage Vital Signs ED Triage Vitals  Encounter Vitals Group     BP 03/17/23 1201 118/80      Systolic BP Percentile --      Diastolic BP Percentile --      Pulse Rate 03/17/23 1201 84     Resp 03/17/23 1201 17     Temp 03/17/23 1201 98.5 F (36.9 C)     Temp Source 03/17/23 1201 Oral     SpO2 03/17/23 1201 97 %     Weight --      Height --      Head Circumference --      Peak Flow --      Pain Score 03/17/23 1202 4     Pain Loc --      Pain Education --      Exclude from Growth Chart --    No data found.  Updated Vital Signs BP 118/80 (BP Location: Right Arm)   Pulse 84   Temp 98.5 F (36.9 C) (Oral)   Resp 17   SpO2 97%    Physical Exam Vitals and nursing note reviewed.  Constitutional:      General: She is not in acute distress.    Appearance: Normal appearance. She is normal weight. She is ill-appearing.  HENT:     Head: Normocephalic and atraumatic.     Right Ear: Tympanic membrane and external ear  normal.     Left Ear: Tympanic membrane and external ear normal.     Ears:     Comments: Significant eustachian tube dysfunction noted bilaterally    Nose:     Right Sinus: Maxillary sinus tenderness present.     Left Sinus: Maxillary sinus tenderness present.     Comments: Turbinates are erythematous/edematous    Mouth/Throat:     Mouth: Mucous membranes are moist.     Pharynx: Oropharynx is clear.  Eyes:     Extraocular Movements: Extraocular movements intact.     Conjunctiva/sclera: Conjunctivae normal.     Pupils: Pupils are equal, round, and reactive to light.  Cardiovascular:     Rate and Rhythm: Normal rate and regular rhythm.     Pulses: Normal pulses.     Heart sounds: Normal heart sounds.  Pulmonary:     Effort: Pulmonary effort is normal.     Breath sounds: Normal breath sounds. No wheezing, rhonchi or rales.  Musculoskeletal:        General: Normal range of motion.     Cervical back: Normal range of motion and neck supple.  Skin:    General: Skin is warm and dry.  Neurological:     General: No focal deficit present.     Mental Status:  She is alert and oriented to person, place, and time. Mental status is at baseline.  Psychiatric:        Mood and Affect: Mood normal.        Behavior: Behavior normal.        Thought Content: Thought content normal.      UC Treatments / Results  Labs (all labs ordered are listed, but only abnormal results are displayed) Labs Reviewed - No data to display  EKG   Radiology No results found.  Procedures Procedures (including critical care time)  Medications Ordered in UC Medications - No data to display  Initial Impression / Assessment and Plan / UC Course  I have reviewed the triage vital signs and the nursing notes.  Pertinent labs & imaging results that were available during my care of the patient were reviewed by me and considered in my medical decision making (see chart for details).     Subacute maxillary sinusitis-Rx'd doxycycline  100 mg capsule: Take 1 capsule twice daily x 7 days; 2.  Sinus pressure-Rx'd prednisone  20 mg tablet: Take 3 tabs p.o. daily x 5 days. Advised patient to take medications as directed with food to completion.  Advised patient to take prednisone  with first dose of doxycycline  for the next 5 of 7 days.  Encouraged to increase daily water intake to 64 ounces per day while taking these medications.  Advised if symptoms worsen and/or unresolved please follow-up with your PCP or here for further evaluation.  Patient discharged home, hemodynamically stable.  Work note provided to patient prior to discharge today per request. Final Clinical Impressions(s) / UC Diagnoses   Final diagnoses:  Subacute maxillary sinusitis  Nasal sinus congestion     Discharge Instructions      Advised patient to take medications as directed with food to completion.  Advised patient to take prednisone  with first dose of doxycycline  for the next 5 of 7 days.  Encouraged to increase daily water intake to 64 ounces per day while taking these medications.  Advised if  symptoms worsen and/or unresolved please follow-up with your PCP or here for further evaluation.     ED Prescriptions     Medication  Sig Dispense Auth. Provider   doxycycline  (VIBRAMYCIN ) 100 MG capsule Take 1 capsule (100 mg total) by mouth 2 (two) times daily for 7 days. 14 capsule Aanya Haynes, FNP   predniSONE  (DELTASONE ) 20 MG tablet Take 3 tabs PO daily x 5 days. 15 tablet Arnella Pralle, FNP      PDMP not reviewed this encounter.   Teddy Sharper, FNP 03/17/23 1340

## 2023-05-23 ENCOUNTER — Ambulatory Visit
Admission: EM | Admit: 2023-05-23 | Discharge: 2023-05-23 | Disposition: A | Discharge status: Home / Self Care | Payer: Self-pay | Attending: Family Medicine | Admitting: Family Medicine

## 2023-05-23 ENCOUNTER — Other Ambulatory Visit: Payer: Self-pay

## 2023-05-23 DIAGNOSIS — J020 Streptococcal pharyngitis: Secondary | ICD-10-CM

## 2023-05-23 LAB — POCT RAPID STREP A (OFFICE): Rapid Strep A Screen: POSITIVE — AB

## 2023-05-23 MED ORDER — CEPHALEXIN 500 MG PO CAPS: 500.0000 mg | ORAL_CAPSULE | Freq: Two times a day (BID) | ORAL | 0 refills | Status: DC

## 2023-05-23 NOTE — Discharge Instructions (Signed)
 Take your antibiotic 2 times a day for 10 full days.  Do not stop early Take 2 doses today May take Tylenol or ibuprofen for pain and fever Salt water gargles, Chloraseptic spray may help with pain

## 2023-05-23 NOTE — ED Triage Notes (Signed)
 Has c/o sore throat x 2-3 days. Reports it got worse last night, feels like left side of neck is swollen and hurts to touch. Had fever Wednesday, reports Wednesday night had vomiting and diarrhea. Has been taking claritin.

## 2023-05-23 NOTE — ED Provider Notes (Signed)
 [facility]    CSN: [id number] Arrival date & time: 05/23/23  4098      History   Chief Complaint Chief Complaint  Patient presents with   Sore Throat    HPI April Powell is a 48 y.o. female.   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  This been strep throat in the facility.  She has had a painful sore throat for the last 2 to 3 days.  No fever or chills.  No headache or bodyaches.  No known exposure to COVID or other illness She does states she has an allergy to amoxicillin.  It causes hives.  In her medicine review, she has had Augmentin twice in the last few years.  History reviewed. No pertinent past medical history.  There are no active problems to display for this patient.   History reviewed. No pertinent surgical history.  OB History   No obstetric history on file.      Home Medications    Prior to Admission medications   Medication Sig Start Date End Date Taking? Authorizing Provider  cephALEXin (KEFLEX) 500 MG capsule Take 1 capsule (500 mg total) by mouth 2 (two) times daily. 05/23/23  Yes Stephany Ehrich, MD  loratadine (CLARITIN) 10 MG tablet Take 10 mg by mouth daily.   Yes [provider]    Family History Family History  Problem Relation Age of Onset   Cervical cancer Mother    Diabetes Father    Hypertension Father    Diabetes Brother    Hypertension Brother    Diabetes Maternal Aunt     Social History Social History   Tobacco Use   Smoking status: Never   Smokeless tobacco: Never  Vaping Use   Vaping status: Never Used  Substance Use Topics   Alcohol use: No   Drug use: No     Allergies   Amoxicillin   Review of Systems Review of Systems See HPI  Physical Exam Triage Vital Signs ED Triage Vitals  Encounter Vitals Group     BP 05/23/23 0828 (!) 121/50     Systolic BP Percentile --      Diastolic BP Percentile --      Pulse Rate 05/23/23 0828 88     Resp 05/23/23 0828 16     Temp  05/23/23 0828 98.2 F (36.8 C)     Temp src --      SpO2 05/23/23 0828 98 %     Weight --      Height --      Head Circumference --      Peak Flow --      Pain Score 05/23/23 0831 8     Pain Loc --      Pain Education --      Exclude from Growth Chart --    No data found.  Updated Vital Signs BP (!) 121/50   Pulse 88   Temp 98.2 F (36.8 C)   Resp 16   LMP 05/08/2023 (Approximate)   SpO2 98%      Physical Exam Constitutional:      General: She is not in acute distress.    Appearance: She is well-developed. She is ill-appearing.  HENT:     Head: Normocephalic and atraumatic.     Mouth/Throat:     Mouth: Mucous membranes are moist.     Pharynx: Uvula midline. Pharyngeal swelling and posterior oropharyngeal erythema present.     Tonsils: Tonsillar  exudate present. 1+ on the right. 1+ on the left.  Eyes:     Conjunctiva/sclera: Conjunctivae normal.     Pupils: Pupils are equal, round, and reactive to light.  Cardiovascular:     Rate and Rhythm: Normal rate.  Pulmonary:     Effort: Pulmonary effort is normal. No respiratory distress.  Musculoskeletal:        General: Normal range of motion.     Cervical back: Normal range of motion.  Lymphadenopathy:     Cervical: Cervical adenopathy present.  Skin:    General: Skin is warm and dry.     Findings: No rash.  Neurological:     Mental Status: She is alert.      UC Treatments / Results  Labs (all labs ordered are listed, but only abnormal results are displayed) Labs Reviewed  POCT RAPID STREP A (OFFICE) - Abnormal; Notable for the following components:      Result Value   Rapid Strep A Screen Positive (*)    All other components within normal limits    EKG   Radiology No results found.  Procedures Procedures (including critical care time)  Medications Ordered in UC Medications - No data to display  Initial Impression / Assessment and Plan / UC Course  I have reviewed the triage vital signs and the  nursing notes.  Pertinent labs & imaging results that were available during my care of the patient were reviewed by me and considered in my medical decision making (see chart for details).     Final Clinical Impressions(s) / UC Diagnoses   Final diagnoses:  Strep throat     Discharge Instructions      Take your antibiotic 2 times a day for 10 full days.  Do not stop early Take 2 doses today May take Tylenol or ibuprofen for pain and fever Salt water gargles, Chloraseptic spray may help with pain   ED Prescriptions     Medication Sig Dispense Auth. Provider   cephALEXin (KEFLEX) 500 MG capsule Take 1 capsule (500 mg total) by mouth 2 (two) times daily. 20 capsule Stephany Ehrich, MD      PDMP not reviewed this encounter.   Stephany Ehrich, MD 05/23/23 [phone]

## 2023-06-14 ENCOUNTER — Ambulatory Visit
Admission: EM | Admit: 2023-06-14 | Discharge: 2023-06-14 | Disposition: A | Discharge status: Home / Self Care | Payer: Self-pay

## 2023-06-14 ENCOUNTER — Other Ambulatory Visit: Payer: Self-pay

## 2023-06-14 ENCOUNTER — Encounter: Payer: Self-pay | Admitting: Emergency Medicine

## 2023-06-14 DIAGNOSIS — J020 Streptococcal pharyngitis: Secondary | ICD-10-CM

## 2023-06-14 DIAGNOSIS — J029 Acute pharyngitis, unspecified: Secondary | ICD-10-CM

## 2023-06-14 LAB — POCT RAPID STREP A (OFFICE): Rapid Strep A Screen: POSITIVE — AB

## 2023-06-14 MED ORDER — AZITHROMYCIN 500 MG PO TABS: 500.0000 mg | ORAL_TABLET | Freq: Every day | ORAL | 0 refills | Status: AC

## 2023-06-14 MED ORDER — PREDNISONE 20 MG PO TABS: ORAL_TABLET | ORAL | 0 refills | Status: AC

## 2023-06-14 NOTE — ED Provider Notes (Signed)
 [facility]    CSN: [id number] Arrival date & time: 06/14/23  0805      History   Chief Complaint Chief Complaint  Patient presents with   Sore Throat    HPI April Powell is a 48 y.o. female.   HPI 48 year old female presents to urgent care with sore throat and burning with ear fullness for 5 days.  Reports was evaluated here on 05/23/2023 for strep pharyngitis.  Please see epic for that encounter note.  History reviewed. No pertinent past medical history.  There are no active problems to display for this patient.   History reviewed. No pertinent surgical history.  OB History   No obstetric history on file.      Home Medications    Prior to Admission medications   Medication Sig Start Date End Date Taking? Authorizing Provider  azithromycin (ZITHROMAX) 500 MG tablet Take 1 tablet (500 mg total) by mouth daily for 5 days. 06/14/23 06/19/23 Yes Leonides Ramp, FNP  loratadine (CLARITIN) 10 MG tablet Take 10 mg by mouth daily.   Yes [provider]  olopatadine (PATADAY) 0.1 % ophthalmic solution 1 drop 2 (two) times daily.   Yes [provider]  Polyethyl Glycol-Propyl Glycol (SYSTANE) 0.4-0.3 % SOLN Apply to eye.   Yes [provider]  predniSONE  (DELTASONE ) 20 MG tablet Take 3 tabs PO daily x 5 days. 06/14/23  Yes Leonides Ramp, FNP    Family History Family History  Problem Relation Age of Onset   Cervical cancer Mother    Diabetes Father    Hypertension Father    Diabetes Brother    Hypertension Brother    Diabetes Maternal Aunt     Social History Social History   Tobacco Use   Smoking status: Never   Smokeless tobacco: Never  Vaping Use   Vaping status: Never Used  Substance Use Topics   Alcohol use: No   Drug use: No     Allergies   Amoxicillin    Review of Systems Review of Systems  HENT:  Positive for ear pain and sore throat.   All other systems reviewed and are negative.    Physical Exam Triage  Vital Signs ED Triage Vitals [06/14/23 0839]  Encounter Vitals Group     BP      Systolic BP Percentile      Diastolic BP Percentile      Pulse      Resp      Temp      Temp src      SpO2      Weight      Height      Head Circumference      Peak Flow      Pain Score 8     Pain Loc      Pain Education      Exclude from Growth Chart    No data found.  Updated Vital Signs BP 111/72 (BP Location: Right Arm)   Pulse 75   Temp 98.3 F (36.8 C) (Oral)   Resp 16   LMP 05/08/2023 (Approximate)   SpO2 100%   Visual Acuity Right Eye Distance:   Left Eye Distance:   Bilateral Distance:    Right Eye Near:   Left Eye Near:    Bilateral Near:     Physical Exam Vitals and nursing note reviewed.  Constitutional:      Appearance: Normal appearance. She is well-developed and normal weight. She is ill-appearing.  HENT:  Head: Normocephalic and atraumatic.     Right Ear: Tympanic membrane, ear canal and external ear normal.     Left Ear: Tympanic membrane, ear canal and external ear normal.     Mouth/Throat:     Mouth: Mucous membranes are moist.     Pharynx: Oropharynx is clear. Uvula midline. Posterior oropharyngeal erythema and uvula swelling present.     Tonsils: 1+ on the right. 1+ on the left.  Eyes:     Extraocular Movements: Extraocular movements intact.     Conjunctiva/sclera: Conjunctivae normal.     Pupils: Pupils are equal, round, and reactive to light.  Cardiovascular:     Rate and Rhythm: Normal rate and regular rhythm.     Pulses: Normal pulses.     Heart sounds: Normal heart sounds.  Pulmonary:     Effort: Pulmonary effort is normal.     Breath sounds: Normal breath sounds. No wheezing, rhonchi or rales.  Musculoskeletal:        General: Normal range of motion.     Cervical back: Normal range of motion and neck supple. Tenderness present.  Lymphadenopathy:     Cervical: Cervical adenopathy present.  Skin:    General: Skin is warm and dry.   Neurological:     General: No focal deficit present.     Mental Status: She is alert and oriented to person, place, and time. Mental status is at baseline.  Psychiatric:        Mood and Affect: Mood normal.        Behavior: Behavior normal.      UC Treatments / Results  Labs (all labs ordered are listed, but only abnormal results are displayed) Labs Reviewed  POCT RAPID STREP A (OFFICE) - Abnormal; Notable for the following components:      Result Value   Rapid Strep A Screen Positive (*)    All other components within normal limits    EKG   Radiology No results found.  Procedures Procedures (including critical care time)  Medications Ordered in UC Medications - No data to display  Initial Impression / Assessment and Plan / UC Course  I have reviewed the triage vital signs and the nursing notes.  Pertinent labs & imaging results that were available during my care of the patient were reviewed by me and considered in my medical decision making (see chart for details).     MDM: 1.  Strep pharyngitis-rapid strep a screen positive, Rx'd Zithromax 500 mg tablet: Take 1 tablet daily x 5 days; 2.  Sore throat-Rx prednisone  20 mg tablet: Take 3 tabs p.o. daily x 5 days. Advised patient to take medications as directed with food to completion.  Advised patient to take prednisone  with Zithromax daily for 5 days.  Encouraged increase daily water intake to 64 ounces per day while taking these medications.  Advised if symptoms worsen and/or unresolved please follow-up with your PCP, ENT, or here for further evaluation. Final Clinical Impressions(s) / UC Diagnoses   Final diagnoses:  Strep pharyngitis  Sore throat     Discharge Instructions      Advised patient to take medications as directed with food to completion.  Advised patient to take prednisone  with Zithromax daily for 5 days.  Encouraged increase daily water intake to 64 ounces per day while taking these medications.   Advised if symptoms worsen and/or unresolved please follow-up with your PCP, ENT, or here for further evaluation.     ED Prescriptions  Medication Sig Dispense Auth. Provider   azithromycin (ZITHROMAX) 500 MG tablet Take 1 tablet (500 mg total) by mouth daily for 5 days. 5 tablet Damel Querry, FNP   predniSONE  (DELTASONE ) 20 MG tablet Take 3 tabs PO daily x 5 days. 15 tablet Elier Zellars, FNP      PDMP not reviewed this encounter.   Leonides Ramp, FNP 06/14/23 [phone]

## 2023-06-14 NOTE — Discharge Instructions (Addendum)
 Advised patient to take medications as directed with food to completion.  Advised patient to take prednisone  with Zithromax daily for 5 days.  Encouraged increase daily water intake to 64 ounces per day while taking these medications.  Advised if symptoms worsen and/or unresolved please follow-up with your PCP, ENT, or here for further evaluation.

## 2023-06-14 NOTE — ED Triage Notes (Addendum)
 Patient presents to Urgent Care with complaints of sore throat, burning, ear fullness of left ear since 5 days ago. Patient reports symptoms feel like strep again.  Had strep throat and finished the Keflex . Did take Advil for pain. Did change toothbrush.

## 2024-04-08 ENCOUNTER — Ambulatory Visit: Payer: Self-pay | Admitting: Urgent Care
